# Patient Record
Sex: Female | Born: 1960 | Race: White | Hispanic: No | Marital: Married | Smoking: Never smoker
Health system: Southern US, Community
[De-identification: ages and names within clinical notes are randomized; demographics above are authoritative.]

## PROBLEM LIST (undated history)

## (undated) DIAGNOSIS — J359 Chronic disease of tonsils and adenoids, unspecified: Secondary | ICD-10-CM

## (undated) DIAGNOSIS — I480 Paroxysmal atrial fibrillation: Secondary | ICD-10-CM

## (undated) DIAGNOSIS — R202 Paresthesia of skin: Secondary | ICD-10-CM

## (undated) DIAGNOSIS — G5712 Meralgia paresthetica, left lower limb: Secondary | ICD-10-CM

## (undated) DIAGNOSIS — M1612 Unilateral primary osteoarthritis, left hip: Secondary | ICD-10-CM

## (undated) DIAGNOSIS — L719 Rosacea, unspecified: Secondary | ICD-10-CM

## (undated) DIAGNOSIS — Z6841 Body Mass Index (BMI) 40.0 and over, adult: Secondary | ICD-10-CM

## (undated) DIAGNOSIS — R42 Dizziness and giddiness: Secondary | ICD-10-CM

## (undated) DIAGNOSIS — R7303 Prediabetes: Secondary | ICD-10-CM

## (undated) DIAGNOSIS — M1990 Unspecified osteoarthritis, unspecified site: Secondary | ICD-10-CM

## (undated) DIAGNOSIS — E669 Obesity, unspecified: Secondary | ICD-10-CM

## (undated) DIAGNOSIS — E785 Hyperlipidemia, unspecified: Secondary | ICD-10-CM

## (undated) DIAGNOSIS — R001 Bradycardia, unspecified: Secondary | ICD-10-CM

## (undated) DIAGNOSIS — F419 Anxiety disorder, unspecified: Secondary | ICD-10-CM

## (undated) DIAGNOSIS — R600 Localized edema: Secondary | ICD-10-CM

## (undated) HISTORY — PX: OTHER SURGICAL HISTORY: SHX169

## (undated) HISTORY — PX: TONSILLECTOMY: SUR1361

## (undated) HISTORY — DX: Rosacea, unspecified: L71.9

## (undated) HISTORY — DX: Unspecified osteoarthritis, unspecified site: M19.90

## (undated) HISTORY — DX: Body Mass Index (BMI) 40.0 and over, adult: Z684

## (undated) HISTORY — DX: Prediabetes: R73.03

## (undated) HISTORY — DX: Paresthesia of skin: R20.2

## (undated) HISTORY — PX: COLONOSCOPY: SHX174

## (undated) HISTORY — DX: Anxiety disorder, unspecified: F41.9

## (undated) HISTORY — DX: Meralgia paresthetica, left lower limb: G57.12

## (undated) HISTORY — DX: Chronic disease of tonsils and adenoids, unspecified: J35.9

## (undated) HISTORY — DX: Obesity, unspecified: E66.9

## (undated) HISTORY — DX: Unilateral primary osteoarthritis, left hip: M16.12

## (undated) HISTORY — DX: Localized edema: R60.0

## (undated) HISTORY — DX: Hyperlipidemia, unspecified: E78.5

---

## 2003-07-30 ENCOUNTER — Other Ambulatory Visit: Admission: RE | Admit: 2003-07-30 | Discharge: 2003-07-30 | Payer: Self-pay | Admitting: Gastroenterology

## 2004-07-20 ENCOUNTER — Other Ambulatory Visit: Admission: RE | Admit: 2004-07-20 | Discharge: 2004-07-20 | Payer: Self-pay | Admitting: Family Medicine

## 2005-08-30 ENCOUNTER — Other Ambulatory Visit: Admission: RE | Admit: 2005-08-30 | Discharge: 2005-08-30 | Payer: Self-pay | Admitting: Family Medicine

## 2005-09-25 ENCOUNTER — Encounter: Admission: RE | Admit: 2005-09-25 | Discharge: 2005-09-25 | Payer: Self-pay | Admitting: Family Medicine

## 2006-12-10 ENCOUNTER — Other Ambulatory Visit: Admission: RE | Admit: 2006-12-10 | Discharge: 2006-12-10 | Payer: Self-pay | Admitting: Family Medicine

## 2007-12-12 ENCOUNTER — Other Ambulatory Visit: Admission: RE | Admit: 2007-12-12 | Discharge: 2007-12-12 | Payer: Self-pay | Admitting: Family Medicine

## 2008-07-28 ENCOUNTER — Encounter: Admission: RE | Admit: 2008-07-28 | Discharge: 2008-07-28 | Payer: Self-pay | Admitting: Family Medicine

## 2008-12-25 ENCOUNTER — Other Ambulatory Visit: Admission: RE | Admit: 2008-12-25 | Discharge: 2008-12-25 | Payer: Self-pay | Admitting: Family Medicine

## 2010-02-04 ENCOUNTER — Encounter: Admission: RE | Admit: 2010-02-04 | Discharge: 2010-02-04 | Payer: Self-pay | Admitting: Family Medicine

## 2010-05-21 ENCOUNTER — Ambulatory Visit (HOSPITAL_BASED_OUTPATIENT_CLINIC_OR_DEPARTMENT_OTHER)
Admission: RE | Admit: 2010-05-21 | Discharge: 2010-05-21 | Payer: Self-pay | Source: Home / Self Care | Attending: Family Medicine | Admitting: Family Medicine

## 2011-10-19 ENCOUNTER — Other Ambulatory Visit: Payer: Self-pay | Admitting: Family Medicine

## 2011-10-19 DIAGNOSIS — Z1231 Encounter for screening mammogram for malignant neoplasm of breast: Secondary | ICD-10-CM

## 2011-10-23 ENCOUNTER — Ambulatory Visit
Admission: RE | Admit: 2011-10-23 | Discharge: 2011-10-23 | Disposition: A | Discharge status: Home / Self Care | Payer: 59 | Source: Ambulatory Visit | Attending: Family Medicine | Admitting: Family Medicine

## 2011-10-23 DIAGNOSIS — Z1231 Encounter for screening mammogram for malignant neoplasm of breast: Secondary | ICD-10-CM

## 2013-04-15 ENCOUNTER — Other Ambulatory Visit (HOSPITAL_BASED_OUTPATIENT_CLINIC_OR_DEPARTMENT_OTHER): Payer: Self-pay | Admitting: Family Medicine

## 2013-04-15 DIAGNOSIS — R42 Dizziness and giddiness: Secondary | ICD-10-CM

## 2013-04-17 ENCOUNTER — Ambulatory Visit (HOSPITAL_BASED_OUTPATIENT_CLINIC_OR_DEPARTMENT_OTHER)
Admission: RE | Admit: 2013-04-17 | Discharge: 2013-04-17 | Disposition: A | Discharge status: Home / Self Care | Payer: 59 | Source: Ambulatory Visit | Attending: Family Medicine | Admitting: Family Medicine

## 2013-04-17 DIAGNOSIS — I10 Essential (primary) hypertension: Secondary | ICD-10-CM | POA: Insufficient documentation

## 2013-04-17 DIAGNOSIS — R42 Dizziness and giddiness: Secondary | ICD-10-CM | POA: Insufficient documentation

## 2013-04-17 DIAGNOSIS — I6529 Occlusion and stenosis of unspecified carotid artery: Secondary | ICD-10-CM | POA: Insufficient documentation

## 2013-04-18 ENCOUNTER — Ambulatory Visit (HOSPITAL_BASED_OUTPATIENT_CLINIC_OR_DEPARTMENT_OTHER): Payer: 59

## 2013-04-29 ENCOUNTER — Ambulatory Visit (HOSPITAL_BASED_OUTPATIENT_CLINIC_OR_DEPARTMENT_OTHER)
Admission: RE | Admit: 2013-04-29 | Discharge: 2013-04-29 | Disposition: A | Discharge status: Home / Self Care | Payer: 59 | Source: Ambulatory Visit | Attending: Family Medicine | Admitting: Family Medicine

## 2013-04-29 ENCOUNTER — Other Ambulatory Visit (HOSPITAL_BASED_OUTPATIENT_CLINIC_OR_DEPARTMENT_OTHER): Payer: Self-pay | Admitting: Family Medicine

## 2013-04-29 DIAGNOSIS — R42 Dizziness and giddiness: Secondary | ICD-10-CM | POA: Insufficient documentation

## 2013-05-29 ENCOUNTER — Encounter: Payer: Self-pay | Admitting: Neurology

## 2013-05-29 ENCOUNTER — Telehealth: Payer: Self-pay | Admitting: *Deleted

## 2013-05-29 ENCOUNTER — Ambulatory Visit (INDEPENDENT_AMBULATORY_CARE_PROVIDER_SITE_OTHER): Payer: 59 | Admitting: Neurology

## 2013-05-29 VITALS — BP 130/82 | HR 68 | Temp 98.0°F | Resp 16 | Ht 67.0 in | Wt 270.8 lb

## 2013-05-29 DIAGNOSIS — H8120 Vestibular neuronitis, unspecified ear: Secondary | ICD-10-CM

## 2013-05-29 LAB — BASIC METABOLIC PANEL
BUN: 13 mg/dL (ref 6–23)
CALCIUM: 9.3 mg/dL (ref 8.4–10.5)
CHLORIDE: 103 meq/L (ref 96–112)
CO2: 26 mEq/L (ref 19–32)
Creatinine, Ser: 0.8 mg/dL (ref 0.4–1.2)
GFR: 85.37 mL/min (ref >60.00)
GLUCOSE: 92 mg/dL (ref 70–99)
Potassium: 3.6 mEq/L (ref 3.5–5.1)
Sodium: 137 mEq/L (ref 135–145)

## 2013-05-29 NOTE — Patient Instructions (Signed)
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  I don't think it is anything serious. 1.  I would like to order two tests, namely audiometry and electronystagmography. 2.  After the above testing is performed, we can consider starting oxcarbazepine (Trileptal), which is a seizure medication that may be helpful in treating these dizzy spells. 3.  We may contact you regarding getting further imaging of the head. 4.  Let's follow up in 4 weeks and see how things are going from there.

## 2013-05-29 NOTE — Addendum Note (Signed)
Addended by: Glean SalenVAN DER GLAS, Maurico Perrell E on: 05/29/2013 10:49 AM   Modules accepted: Orders

## 2013-05-29 NOTE — Telephone Encounter (Signed)
Spoke with Ms Para MarchDuncan she is aware of appt  With Dr Jenne PaneBates on 06/03/13 at 1:15pm  Dr's note faxed to [phone]

## 2013-05-29 NOTE — Progress Notes (Signed)
NEUROLOGY CONSULTATION NOTE  Deborah Baldwin MRN: [id number] DOB: [birth year]  Referring provider: Gillermina Hu, FNP-C Primary care provider: Gillermina Hu, FNP-C  Reason for consult:  Dizziness  HISTORY OF PRESENT ILLNESS: Deborah Baldwin is a 53 year old right-handed woman with history of hypertension who presents for dizziness..  Records and images were personally reviewed where available.    Symptoms started a year ago.  She experiences brief spell of dizziness, described as a sense of movement as if turning head too quickly, but not clear spinning.  It is not associated with nausea, vomiting, headache, visual disturbance (double vision, blurred vision, vision loss), lightheadedness, facial numbness or focal symptoms of the extremities.  It usually lasts 5-10 seconds but has lasted up to 30 seconds.  It occurs only when sitting and not when laying down.  It occurs spontaneously and not triggered by anything such as change in head movement or position.  It occurs while driving, reading, at her desk on the computer, cooking or just sitting and having a conversation with someone.  She notes some occasional ringing in the ears, as well as hearing a heart beat in her ears or head.  There is no hearing loss that she is aware of, no aural fullness, or ear pain.  She has some nasal congestion but no illnesses.  She denies history of migraine, seizures or past positional vertigo.  They used to occur occasionally, but in October they began occuring three times a day.  However, they have become less frequent over the past month.  Last episode was about over a week ago.  04/29/13 MRI BRAIN WO:  nonspecific small white matter hyperintensities.  Cannot appreciate any vascular loop around the vestibular nerve. 04/17/13 CAROTID DUPLEX:  no hemodynamically significant stenosis.  PAST MEDICAL HISTORY: Past Medical History  Diagnosis Date  . hypertension     PAST SURGICAL HISTORY: Past Surgical History    Procedure Laterality Date  . Endoablatin    . Tonsillectomy      MEDICATIONS: Lisinopril-HCTZ 20-12.5mg  daily  ALLERGIES: Allergies  Allergen Reactions  . Tetracyclines & Related Hives    FAMILY HISTORY: Family History  Problem Relation Age of Onset  . Prostate cancer Father     SOCIAL HISTORY: History   Social History  . Marital Status: Married    Spouse Name: N/A    Number of Children: N/A  . Years of Education: N/A   Occupational History  . Not on file.   Social History Main Topics  . Smoking status: Never Smoker   . Smokeless tobacco: Not on file  . Alcohol Use: 5.0 oz/week    10 drink(s) per week     Comment: 1-2  daily  . Drug Use: Not on file  . Sexual Activity: Not on file   Other Topics Concern  . Not on file   Social History Narrative  . No narrative on file    REVIEW OF SYSTEMS: Constitutional: No fevers, chills, or sweats, no generalized fatigue, change in appetite Eyes: No visual changes, double vision, eye pain Ear, nose and throat: No hearing loss, ear pain, nasal congestion, sore throat Cardiovascular: No chest pain, palpitations Respiratory:  No shortness of breath at rest or with exertion, wheezes GastrointestinaI: No nausea, vomiting, diarrhea, abdominal pain, fecal incontinence Genitourinary:  No dysuria, urinary retention or frequency Musculoskeletal:  No neck pain, back pain Integumentary: No rash, pruritus, skin lesions Neurological: as above Psychiatric: No depression, insomnia, anxiety Endocrine: No palpitations, fatigue, diaphoresis, mood swings,  change in appetite, change in weight, increased thirst Hematologic/Lymphatic:  No anemia, purpura, petechiae. Allergic/Immunologic: no itchy/runny eyes, nasal congestion, recent allergic reactions, rashes  PHYSICAL EXAM: Filed Vitals:   05/29/13 0829  BP: 130/82  Pulse: 68  Temp: 98 F (36.7 C)  Resp: 16   General: No acute distress Head:  Normocephalic/atraumatic Neck:  supple, no paraspinal tenderness, full range of motion Back: No paraspinal tenderness Heart: regular rate and rhythm Lungs: Clear to auscultation bilaterally. Vascular: No carotid bruits. Neurological Exam: Mental status: alert and oriented to person, place, and time, speech fluent and not dysarthric, language intact. Cranial nerves: CN I: not tested CN II: pupils equal, round and reactive to light, visual fields intact, fundi unremarkable. CN III, IV, VI:  full range of motion, no nystagmus, no ptosis CN V: facial sensation intact CN VII: upper and lower face symmetric CN VIII: hearing intact CN IX, X: gag intact, uvula midline CN XI: sternocleidomastoid and trapezius muscles intact CN XII: tongue midline Bulk & Tone: normal, no fasciculations. Motor: 5/5 throughout Sensation: pinprick and vibration intact Deep Tendon Reflexes: 2+ throughout, toes down Finger to nose testing: no dysmetria Heel to shin: no dysmetria Gait: normal stance and stride.  Able to walk in tandem. Romberg negative. Dix-Hallpike negative. Hyperventilation triggered a spell.  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  Will order audiometry and electronystagmography (may need to refer to ENT for this). 2.  Will review MRI with radiology to see if there is any appearance of vascular loop compressing the vestibular nerve on either side.  May consider further imaging. 3   After the vestibular testing, will consider starting oxcarbazepine XR 150mg  daily to treat symptoms (will check BMP today). 4.  Follow up in 4 weeks.  45 minutes spent with patient, over 50% spent counseling and coordinating care.  Thank you for allowing me to take part in the care of this patient.  Shon MilletAdam Jaffe, DO  CC:  Gillermina Huracy Thomas, FNP-C

## 2013-05-30 ENCOUNTER — Telehealth: Payer: Self-pay | Admitting: *Deleted

## 2013-05-30 NOTE — Telephone Encounter (Signed)
Patient is aware of MRI being neg

## 2013-07-03 ENCOUNTER — Ambulatory Visit: Payer: 59 | Admitting: Cardiology

## 2013-07-30 ENCOUNTER — Encounter: Payer: Self-pay | Admitting: Cardiology

## 2013-07-30 ENCOUNTER — Ambulatory Visit (INDEPENDENT_AMBULATORY_CARE_PROVIDER_SITE_OTHER): Payer: 59 | Admitting: Cardiology

## 2013-07-30 VITALS — BP 132/87 | HR 75 | Ht 66.0 in | Wt 272.0 lb

## 2013-07-30 DIAGNOSIS — R42 Dizziness and giddiness: Secondary | ICD-10-CM

## 2013-07-30 DIAGNOSIS — E669 Obesity, unspecified: Secondary | ICD-10-CM | POA: Insufficient documentation

## 2013-07-30 DIAGNOSIS — J359 Chronic disease of tonsils and adenoids, unspecified: Secondary | ICD-10-CM | POA: Insufficient documentation

## 2013-07-30 MED ORDER — LISINOPRIL 20 MG PO TABS: 20.0000 mg | ORAL_TABLET | Freq: Every day | ORAL | Status: DC

## 2013-07-30 NOTE — Progress Notes (Signed)
[address] [place], [place]  [address] Phone: [phone] Fax:  [phone]  Date:  07/30/2013   ID:  Deborah MantisMargaret Mendosa, DOB 08/22/1960, MRN [id number]  PCP:  Lenora BoysFRIED, ROBERT L, MD   History of Present Illness: Deborah Baldwin is a 53 y.o. female here for evaluation of dizziness.Light headed, "no Spinning".  Approximately one year ago she began having more frequent episodes of dizziness. This seems to happen when she is sitting down not necessarily when laying down. Each episode lasts approximately 20-45 seconds and these occur almost 3 times per day. Only in day time. Never when evening. Was in intense time at work. Works at [employer]. Now having short episodes per day. No syncope, no nausea. Dietary logs. This can occur while she is on her way to work after eating breakfast. No identifiable triggers noted. She has checked her blood pressure and is normal at that time. No syncope, no visual changes, no weakness. Orthostatic blood pressures were taken and showed mild decrease from lying 126/91, pulse 70-standing 116/80, pulse 76. LDL 136. TSH normal, hemoglobin 14.  Neurology evaluation - MRI EEG OK. Declined antiseizure drug. ENT OK.    Wt Readings from Last 3 Encounters:  07/30/13 272 lb (123.378 kg)  05/29/13 270 lb 12.8 oz (122.834 kg)     Past Medical History  Diagnosis Date  . hypertension   . Obesity   . Hyperlipidemia   . Arthritis     , Subtolar  . Rosacea     Past Surgical History  Procedure Laterality Date  . Endoablatin    . Tonsillectomy    . Colonoscopy      Current Outpatient Prescriptions  Medication Sig Dispense Refill  . Azelaic Acid (FINACEA) 15 % cream Apply 1 application topically 2 (two) times daily. After skin is thoroughly washed and patted dry, gently but thoroughly massage a thin film of azelaic acid cream into the affected area twice daily, in the morning and evening.      Marland Kitchen. lisinopril-hydrochlorothiazide  (PRINZIDE,ZESTORETIC) 20-12.5 MG per tablet Take 1 tablet by mouth daily.      . Multiple Vitamin (MULTIVITAMIN) tablet Take 1 tablet by mouth daily.      . naproxen (NAPROSYN) 500 MG tablet Take 500 mg by mouth 2 (two) times daily with a meal.      . Omega-3 Fatty Acids ([employer] FISH OIL) 1000 MG CAPS Take 1 capsule by mouth daily.       No current facility-administered medications for this visit.    Allergies:    Allergies  Allergen Reactions  . Minocin [Minocycline Hcl] Hives  . Tetracyclines & Related Hives    Social History:  The patient  reports that she has never smoked. She does not have any smokeless tobacco history on file. She reports that she drinks about 5.0 ounces of alcohol per week. works at [employer]  Family History  Problem Relation Age of Onset  . Prostate cancer Father   . Hypertension Father   . Hypertension Mother   . Colon polyps Mother   . Diverticulitis Mother   . Heart disease Maternal Grandfather   . Colon cancer Maternal Grandfather   . Hypertension Paternal Grandmother   . Dementia Paternal Grandfather     ROS:  Please see the history of present illness.   No strokelike symptoms, no fevers, no cough, no vomiting, no palpitations, no frank syncope, no visual changes.   All other systems  reviewed and negative.   PHYSICAL EXAM: VS:  BP 132/87  Pulse 75  Ht 5\' 6"  (1.676 m)  Wt 272 lb (123.378 kg)  BMI 43.92 kg/m2 Well nourished, well developed, in no acute distress HEENT: normal, [place]/AT, EOMI Neck: no JVD, normal carotid upstroke, no bruit Cardiac:  normal S1, S2; RRR; no murmur Lungs:  clear to auscultation bilaterally, no wheezing, rhonchi or rales Abd: soft, nontender, no hepatomegaly, no bruits Ext:  2+ distal pulses, trace edema Skin: warm and dry GU: deferred Neuro: no focal abnormalities noted, AAO x 3  EKG:  07/30/13-normal rhythm, 75, normal EKG Labs: LDL 136, TSH normal, hemoglobin normal. Unremarkable. Prior medical records  reviewed.     ASSESSMENT AND PLAN:  1. Dizziness-lightheadedness. Extensive workup including MRI, EEG. Neurology evaluation. ENT evaluation unremarkable. She's still having these lightheadedness episodes about 2-3 times a day. Could be associated with stress. I do want to make sure that there are no dangerous arrhythmias associated with these episodes. I do not appreciate any heart murmur. Could this also be associated with change in breathing pattern/hyperventilation like episode which can result in lightheadedness? Her blood pressure at home is usually in the 110 systolic. I will try to discontinue her hydrochlorothiazide portion of her lisinopril and see how this makes her feel. Watch for any signs of elevated blood pressure. We may need to add this back. Consider compression stockings for edema. 2. Morbid Obesity-encourage weight loss. Exercise 3. We will see her back in one month.  Signed, Donato Schultz, MD [facility]  07/30/2013 10:21 AM

## 2013-07-30 NOTE — Patient Instructions (Addendum)
Your physician has recommended you make the following change in your medication:   1. Stop Lisinopril-HCTZ 2. Start Lisinopril 20 mg once daily  Your physician has recommended that you wear a 24 hour holter monitor. Holter monitors are medical devices that record the heart's electrical activity. Doctors most often use these monitors to diagnose arrhythmias. Arrhythmias are problems with the speed or rhythm of the heartbeat. The monitor is a small, portable device. You can wear one while you do your normal daily activities. This is usually used to diagnose what is causing palpitations/syncope (passing out).  Your physician recommends that you schedule a follow-up appointment in: 1 month with Dr. Anne FuSkains.

## 2013-08-05 ENCOUNTER — Encounter (INDEPENDENT_AMBULATORY_CARE_PROVIDER_SITE_OTHER): Payer: 59

## 2013-08-05 ENCOUNTER — Encounter: Payer: Self-pay | Admitting: *Deleted

## 2013-08-05 DIAGNOSIS — R42 Dizziness and giddiness: Secondary | ICD-10-CM

## 2013-08-05 NOTE — Progress Notes (Signed)
Patient ID: Deborah Baldwin, female   DOB: 09/17/1960, 53 y.o.   MRN: [id number] E-Cardio 24 hour holter monitor applied to patient.

## 2013-08-14 ENCOUNTER — Telehealth: Payer: Self-pay | Admitting: Cardiology

## 2013-08-14 NOTE — Telephone Encounter (Signed)
New message     Want monitor results.  OK to leave msg on vm at work or cell

## 2013-08-15 NOTE — Telephone Encounter (Signed)
Spoke with patient advised that results are in but Dr. Anne FuSkains hasn't read the results, advise once he reads the results I will call with the results..Marland Kitchen

## 2013-08-18 ENCOUNTER — Telehealth: Payer: Self-pay | Admitting: Cardiology

## 2013-08-18 NOTE — Telephone Encounter (Signed)
Brief episode of atrial fibrillation occurred in the middle of the night lasting approximately one minutes duration with heart rate ranging from 118-150. I would like for her to come back to clinic to discuss this. Being female with hypertension (CHADS-VASc of 2) like to discuss anticoagulation as well as possible evaluation for sleep study. Interestingly, her marked symptoms of lightheadedness did not correlate with atrial fibrillation or any adverse arrhythmias.  Please have her come back to clinic to discuss.  Donato SchultzMark Juda Toepfer, MD

## 2013-08-19 NOTE — Telephone Encounter (Signed)
LVM for patient to call the office and advised that an Dr. Anne FuSkains wants to see her in the office.

## 2013-08-20 ENCOUNTER — Encounter: Payer: Self-pay | Admitting: Cardiology

## 2013-08-20 ENCOUNTER — Ambulatory Visit (INDEPENDENT_AMBULATORY_CARE_PROVIDER_SITE_OTHER): Payer: 59 | Admitting: Cardiology

## 2013-08-20 VITALS — BP 157/106 | HR 90 | Ht 66.0 in | Wt 275.1 lb

## 2013-08-20 DIAGNOSIS — I4891 Unspecified atrial fibrillation: Secondary | ICD-10-CM

## 2013-08-20 DIAGNOSIS — I10 Essential (primary) hypertension: Secondary | ICD-10-CM

## 2013-08-20 MED ORDER — DILTIAZEM HCL ER COATED BEADS 120 MG PO CP24: 120.0000 mg | ORAL_CAPSULE | Freq: Every day | ORAL | Status: DC

## 2013-08-20 MED ORDER — RIVAROXABAN 20 MG PO TABS: 20.0000 mg | ORAL_TABLET | Freq: Every day | ORAL | Status: DC

## 2013-08-20 MED ORDER — LISINOPRIL-HYDROCHLOROTHIAZIDE 20-12.5 MG PO TABS: 1.0000 | ORAL_TABLET | Freq: Every day | ORAL | Status: DC

## 2013-08-20 MED ORDER — LISINOPRIL 20 MG PO TABS: 20.0000 mg | ORAL_TABLET | Freq: Every day | ORAL | Status: DC

## 2013-08-20 NOTE — Telephone Encounter (Signed)
[facility] Please call back.

## 2013-08-20 NOTE — Telephone Encounter (Signed)
Dr Anne FuSkains original 4/13 note was about pts holter monitor results. Called pt to arrange an appointment with Dr Anne FuSkains today 4/15 at 9:45am. Pt verbalized understanding and confirmed appointment.

## 2013-08-20 NOTE — Progress Notes (Signed)
[address] [place], [place]  [address] Phone: [phone] Fax:  [phone]  Date:  08/20/2013   ID:  Deborah Baldwin, DOB 01/28/1961, MRN [id number]  PCP:  Lenora BoysFRIED, ROBERT L, MD   History of Present Illness: Deborah Baldwin is a 53 y.o. female here for followup of dizziness.Light headed, "no Spinning".  Approximately one year ago she began having more frequent episodes of dizziness. This seems to happen when she is sitting down not necessarily when laying down. Each episode lasts approximately 20-45 seconds and these occur almost 3 times per day. Only in day time. Never when evening. Was in intense time at work. Works at [employer]. Now having short episodes per day. No syncope, no nausea. Dietary logs. This can occur while she is on her way to work after eating breakfast. No identifiable triggers noted. She has checked her blood pressure and is normal at that time. No syncope, no visual changes, no weakness. Orthostatic blood pressures were taken and showed mild decrease from lying 126/91, pulse 70-standing 116/80, pulse 76. LDL 136. TSH normal, hemoglobin 14.  Neurology evaluation - MRI EEG OK. Declined antiseizure drug. ENT OK.   Holter monitor-this demonstrated a brief episode of atrial fibrillation with rapid ventricular response in the nighttime hours and possibly 1 AM. This episode lasted approximately 1 minute duration. She denies any snoring, daytime somnolence. Her husband does have sleep apnea. No excessive alcohol. No recent fevers, infections. TSH as above was normal. She did note on her Holter monitor that she had a brief episode of dizziness during the day however this correlated with normal sinus rhythm.   Wt Readings from Last 3 Encounters:  08/20/13 275 lb 1.9 oz (124.794 kg)  07/30/13 272 lb (123.378 kg)  05/29/13 270 lb 12.8 oz (122.834 kg)     Past Medical History  Diagnosis Date  . hypertension   . Obesity   . Hyperlipidemia   .  Arthritis     , Subtolar  . Rosacea     Past Surgical History  Procedure Laterality Date  . Endoablatin    . Tonsillectomy    . Colonoscopy      Current Outpatient Prescriptions  Medication Sig Dispense Refill  . Azelaic Acid (FINACEA) 15 % cream Apply 1 application topically 2 (two) times daily. After skin is thoroughly washed and patted dry, gently but thoroughly massage a thin film of azelaic acid cream into the affected area twice daily, in the morning and evening.      Marland Kitchen. lisinopril (PRINIVIL,ZESTRIL) 20 MG tablet Take 1 tablet (20 mg total) by mouth daily.  30 tablet  6  . Multiple Vitamin (MULTIVITAMIN) tablet Take 1 tablet by mouth daily.      . naproxen (NAPROSYN) 500 MG tablet Take 500 mg by mouth 2 (two) times daily with a meal.      . Omega-3 Fatty Acids ([employer] FISH OIL) 1000 MG CAPS Take 1 capsule by mouth daily.       No current facility-administered medications for this visit.    Allergies:    Allergies  Allergen Reactions  . Minocin [Minocycline Hcl] Hives  . Tetracyclines & Related Hives    Social History:  The patient  reports that she has never smoked. She does not have any smokeless tobacco history on file. She reports that she drinks about 5 ounces of alcohol per week. works at [employer]  Family History  Problem Relation Age of Onset  .  Prostate cancer Father   . Hypertension Father   . Hypertension Mother   . Colon polyps Mother   . Diverticulitis Mother   . Heart disease Maternal Grandfather   . Colon cancer Maternal Grandfather   . Hypertension Paternal Grandmother   . Dementia Paternal Grandfather     ROS:  Please see the history of present illness.   No strokelike symptoms, no fevers, no cough, no vomiting, no palpitations, no frank syncope, no visual changes.   All other systems reviewed and negative.   PHYSICAL EXAM: VS:  BP 157/106  Pulse 90  Ht 5\' 6"  (1.676 m)  Wt 275 lb 1.9 oz (124.794 kg)  BMI 44.43 kg/m2 Well nourished, well  developed, in no acute distress HEENT: normal, [place]/AT, EOMI Neck: no JVD, normal carotid upstroke, no bruit Cardiac:  normal S1, S2; RRR; no murmur Lungs:  clear to auscultation bilaterally, no wheezing, rhonchi or rales Abd: soft, nontender, no hepatomegaly, no bruits Ext:  2+ distal pulses, trace edema Skin: warm and dry GU: deferred Neuro: no focal abnormalities noted, AAO x 3  EKG:  07/30/13-normal rhythm, 75, normal EKG Labs: LDL 136, TSH normal, hemoglobin normal. Unremarkable. Prior medical records reviewed.     ASSESSMENT AND PLAN:  1. Paroxysmal atrial fibrillation-brief episode noted during Holter monitoring in the middle of the night. This could be associated with sleep apnea. We had lengthy discussion about atrial fibrillation, implications, possibility of risk of stroke. Her CHADS-VASc is 2 (Female and HTN). Because of this, she warrants anticoagulation. We discussed the risks and benefits of this medication including bleeding. We will start Xarelto 20mg  QD. Her renal function and hemoglobin were normal. We will repeat in 6 months. I will also start her on low-dose diltiazem CD 120 mg once a day. This will also help with her blood pressure as well as decreased rate of atrial fibrillation if episode occurs. Since the episode occurred in the middle of the night, I will check a sleep study to ensure that she does not have any episode of sleep apnea which could be a precipitating factor for atrial fibrillation. We discussed other factors such as alcohol use, weight loss. I will check echocardiogram to ensure proper structure and function of her heart. 2. Dizziness-lightheadedness. Extensive workup including MRI, EEG. Neurology evaluation. ENT evaluation unremarkable. She did have a brief episode of atrial fibrillation in the middle of the night which was asymptomatic. She did have an episode of dizziness which she pushed the button on the Holter monitor and this correlated only with normal  sinus rhythm. She's still having these lightheadedness episodes about 2-3 times a day. Could be associated with stress. Breathing pattern/hyperventilation like episode which can result in lightheadedness? Her blood pressure has elevated since a trial of stopping the HCTZ portion of lisinopril. We will resume. 20/12.5. 3. Morbid Obesity-encourage weight loss. Exercise 4. We will see her back in 2 weeks, appointment already scheduled. 5. 30 minute counseling discussion on atrial fibrillation.  Signed, Donato SchultzMark Governor Matos, MD [facility]  08/20/2013 10:14 AM

## 2013-08-20 NOTE — Patient Instructions (Signed)
Your physician has recommended you make the following change in your medication:  1)STOP LISINOPRIL 2)STOP OMEGA 3 3)STOP NAPROYSEN 4)START LISINOPRIL 20-12.5MG  DAILY 5)START DILTIAZEM CD 120MG  DAILY 6)START XARELTO 20MG  EVERY EVENING   Your physician has recommended that you have a sleep study. This test records several body functions during sleep, including: brain activity, eye movement, oxygen and carbon dioxide blood levels, heart rate and rhythm, breathing rate and rhythm, the flow of air through your mouth and nose, snoring, body muscle movements, and chest and belly movement.  Your physician has requested that you have an echocardiogram. Echocardiography is a painless test that uses sound waves to create images of your heart. It provides your doctor with information about the size and shape of your heart and how well your heart's chambers and valves are working. This procedure takes approximately one hour. There are no restrictions for this procedure.  KEEP YOUR FOLLOW UP APPT SCHEDULED ON 09/03/13 @ 11:45AM

## 2013-08-26 ENCOUNTER — Encounter: Payer: Self-pay | Admitting: Cardiology

## 2013-08-26 ENCOUNTER — Ambulatory Visit (HOSPITAL_COMMUNITY): Payer: 59 | Attending: Cardiovascular Disease | Admitting: Cardiology

## 2013-08-26 DIAGNOSIS — I4891 Unspecified atrial fibrillation: Secondary | ICD-10-CM

## 2013-08-26 DIAGNOSIS — I10 Essential (primary) hypertension: Secondary | ICD-10-CM | POA: Insufficient documentation

## 2013-08-26 NOTE — Progress Notes (Signed)
Echo performed. 

## 2013-09-03 ENCOUNTER — Encounter: Payer: Self-pay | Admitting: Cardiology

## 2013-09-03 ENCOUNTER — Ambulatory Visit (INDEPENDENT_AMBULATORY_CARE_PROVIDER_SITE_OTHER): Payer: 59 | Admitting: Cardiology

## 2013-09-03 VITALS — BP 149/100 | HR 81 | Ht 67.0 in | Wt 273.8 lb

## 2013-09-03 DIAGNOSIS — I4891 Unspecified atrial fibrillation: Secondary | ICD-10-CM

## 2013-09-03 DIAGNOSIS — I480 Paroxysmal atrial fibrillation: Secondary | ICD-10-CM | POA: Insufficient documentation

## 2013-09-03 DIAGNOSIS — Z7901 Long term (current) use of anticoagulants: Secondary | ICD-10-CM | POA: Insufficient documentation

## 2013-09-03 NOTE — Progress Notes (Signed)
[address] [place], [place]  [address] Phone: [phone] Fax:  [phone]  Date:  09/03/2013   ID:  Deborah MantisMargaret Porada, DOB 09/07/1960, MRN [id number]  PCP:  Lenora BoysFRIED, ROBERT L, MD   History of Present Illness: Deborah Baldwin is a 53 y.o. female here for follow up of newly discovered paroxysmal atrial fibrillation seen at 1:30 AM with rapid ventricular response during 24-hour Holter monitor. Started anticoagulation with Xarelto. Echocardiogram with mild LVH, normal ejection fraction. Sleep study pending. Asymptomatic. Husband present.   Previous workup was done secondary to dizziness. She did note dizziness on her Holter monitor however this was associated with sinus rhythm.   Light headed, "no Spinning".  Approximately one year ago she began having more frequent episodes of dizziness. This seems to happen when she is sitting down not necessarily when laying down. Each episode lasts approximately 20-45 seconds and these occur almost 3 times per day. Only in day time. Never when evening. Was in intense time at work. Works at [employer]. Now having short episodes per day. No syncope, no nausea. Dietary logs. This can occur while she is on her way to work after eating breakfast. No identifiable triggers noted. She has checked her blood pressure and is normal at that time. No syncope, no visual changes, no weakness. Orthostatic blood pressures were taken and showed mild decrease from lying 126/91, pulse 70-standing 116/80, pulse 76. LDL 136. TSH normal, hemoglobin 14.  Neurology evaluation - MRI EEG OK. Declined antiseizure drug. ENT OK.   Echocardiogram 08/26/13: - Normal LV size with mild LV hypertrophy. EF 60-65%. Normal RV size and systolic function. No significant valvular abnormalities.  Holter monitor 08/05/13: Brief episode of atrial fibrillation occurred in the middle of the night lasting approximately 1 minute duration with heart rate ranging from 118-150.  Anticoagulation discussion. Discussed with EP. This did not correlate with lightheadedness. She did report lightheadedness at one point but this was with normal rhythm. Sleep study ordered.    Wt Readings from Last 3 Encounters:  09/03/13 273 lb 12.8 oz (124.195 kg)  08/20/13 275 lb 1.9 oz (124.794 kg)  07/30/13 272 lb (123.378 kg)     Past Medical History  Diagnosis Date  . hypertension   . Obesity   . Hyperlipidemia   . Arthritis     , Subtolar  . Rosacea     Past Surgical History  Procedure Laterality Date  . Endoablatin    . Tonsillectomy    . Colonoscopy      Current Outpatient Prescriptions  Medication Sig Dispense Refill  . Azelaic Acid (FINACEA) 15 % cream Apply 1 application topically 2 (two) times daily. After skin is thoroughly washed and patted dry, gently but thoroughly massage a thin film of azelaic acid cream into the affected area twice daily, in the morning and evening.      . diltiazem (CARDIZEM CD) 120 MG 24 hr capsule Take 1 capsule (120 mg total) by mouth daily.  30 capsule  11  . lisinopril-hydrochlorothiazide (PRINZIDE) 20-12.5 MG per tablet Take 1 tablet by mouth daily.  30 tablet  11  . Multiple Vitamin (MULTIVITAMIN) tablet Take 1 tablet by mouth daily.      . rivaroxaban (XARELTO) 20 MG TABS tablet Take 1 tablet (20 mg total) by mouth daily with supper.  30 tablet  11   No current facility-administered medications for this visit.    Allergies:    Allergies  Allergen Reactions  .  Minocin [Minocycline Hcl] Hives  . Tetracyclines & Related Hives    Social History:  The patient  reports that she has never smoked. She does not have any smokeless tobacco history on file. She reports that she drinks about 5 ounces of alcohol per week. works at [employer]  Family History  Problem Relation Age of Onset  . Prostate cancer Father   . Hypertension Father   . Hypertension Mother   . Colon polyps Mother   . Diverticulitis Mother   . Heart  disease Maternal Grandfather   . Colon cancer Maternal Grandfather   . Hypertension Paternal Grandmother   . Dementia Paternal Grandfather     ROS:  Please see the history of present illness.   No strokelike symptoms, no fevers, no cough, no vomiting, no palpitations, no frank syncope, no visual changes.   All other systems reviewed and negative.   PHYSICAL EXAM: VS:  BP 149/100  Pulse 81  Ht 5\' 7"  (1.702 m)  Wt 273 lb 12.8 oz (124.195 kg)  BMI 42.87 kg/m2 Well nourished, well developed, in no acute distress HEENT: normal, [place]/AT, EOMI Neck: no JVD Cardiac:  normal S1, S2; RRR; no murmur Lungs:  clear Abd: obese Ext: trace edema Skin: warm and dry GU: deferred Neuro: no focal abnormalities noted, AAO x 3  EKG:  07/30/13-normal rhythm, 75, normal EKG Labs: LDL 136, TSH normal, hemoglobin normal. Unremarkable. Prior medical records reviewed.     ASSESSMENT AND PLAN:  1. Paroxysmal atrial fibrillation-anticoagulation. Brief episode occurred middle of the night rapid ventricular response. Sleep apnea workup. Echocardiogram with normal EF. Mild LVH. Low-dose diltiazem 120 mg a day. Monitor blood work every 6 months. She will likely have this done at her primary physician. 2. Dizziness-lightheadedness. Extensive workup including MRI, EEG. Neurology evaluation. ENT evaluation unremarkable. Could be associated with stress.  Her blood pressure at home is usually in the 110 systolic. At one point I stopped her HCTZ but her blood pressure became too elevated. We have resumed. 3. Mildly dilated aortic root-39 mm. Periodic evaluation with echocardiogram likely in 2-3 years. Discussed. Blood pressure control important. Weight loss. 4. Morbid Obesity-encourage weight loss. Exercise 5. We will see her back in 6 month. Today spent approximately 30 minutes with her, mostly counseling on atrial fibrillation with her husband.  Signed, Donato SchultzMark Quinn Bartling, MD [facility]  09/03/2013 12:14 PM

## 2013-09-03 NOTE — Patient Instructions (Signed)
Your physician recommends that you continue on your current medications as directed. Please refer to the Current Medication list given to you today.  Your physician wants you to follow-up in: 6 months with Dr. Skains. You will receive a reminder letter in the mail two months in advance. If you don't receive a letter, please call our office to schedule the follow-up appointment.  

## 2014-05-08 DIAGNOSIS — I480 Paroxysmal atrial fibrillation: Secondary | ICD-10-CM

## 2014-05-08 HISTORY — DX: Paroxysmal atrial fibrillation: I48.0

## 2014-07-11 ENCOUNTER — Other Ambulatory Visit: Payer: Self-pay | Admitting: Cardiology

## 2014-08-08 ENCOUNTER — Other Ambulatory Visit: Payer: Self-pay | Admitting: Cardiology

## 2014-08-30 ENCOUNTER — Other Ambulatory Visit: Payer: Self-pay | Admitting: Cardiology

## 2014-09-24 ENCOUNTER — Other Ambulatory Visit: Payer: Self-pay | Admitting: Cardiology

## 2014-10-07 ENCOUNTER — Other Ambulatory Visit: Payer: Self-pay | Admitting: Cardiology

## 2014-10-24 ENCOUNTER — Other Ambulatory Visit: Payer: Self-pay | Admitting: Cardiology

## 2014-10-28 ENCOUNTER — Other Ambulatory Visit: Payer: Self-pay | Admitting: Cardiology

## 2014-10-29 ENCOUNTER — Telehealth: Payer: Self-pay | Admitting: Cardiology

## 2014-10-29 NOTE — Telephone Encounter (Signed)
Pt was due in April, called today to make fu appt with [facility]. His next appt was 01-15-15, pt needs refills of Diltiazem and Xarelto 30 day supply  at [facility]

## 2014-11-03 ENCOUNTER — Other Ambulatory Visit: Payer: Self-pay | Admitting: Cardiology

## 2015-01-05 ENCOUNTER — Other Ambulatory Visit: Payer: Self-pay | Admitting: Cardiology

## 2015-01-15 ENCOUNTER — Ambulatory Visit (INDEPENDENT_AMBULATORY_CARE_PROVIDER_SITE_OTHER): Payer: 59 | Admitting: Cardiology

## 2015-01-15 ENCOUNTER — Encounter: Payer: Self-pay | Admitting: Cardiology

## 2015-01-15 VITALS — BP 132/90 | HR 74 | Ht 66.0 in | Wt 269.8 lb

## 2015-01-15 DIAGNOSIS — Z7901 Long term (current) use of anticoagulants: Secondary | ICD-10-CM

## 2015-01-15 DIAGNOSIS — I480 Paroxysmal atrial fibrillation: Secondary | ICD-10-CM

## 2015-01-15 MED ORDER — DILTIAZEM HCL ER COATED BEADS 120 MG PO CP24: 120.0000 mg | ORAL_CAPSULE | Freq: Every day | ORAL | Status: DC

## 2015-01-15 MED ORDER — RIVAROXABAN 20 MG PO TABS: 20.0000 mg | ORAL_TABLET | Freq: Every day | ORAL | Status: DC

## 2015-01-15 MED ORDER — LISINOPRIL-HYDROCHLOROTHIAZIDE 20-12.5 MG PO TABS
1.0000 | ORAL_TABLET | Freq: Every day | ORAL | Status: AC
Start: 2015-01-15 — End: ?

## 2015-01-15 NOTE — Patient Instructions (Addendum)
Medication Instructions:  The current medical regimen is effective;  continue present plan and medications.  Please call if you decide to have the 30 day event monitor.  Follow-Up: Follow up in 1 year with Dr. Anne Fu.  You will receive a letter in the mail 2 months before you are due.  Please call us when you receive this letter to schedule your follow up appointment.  Thank you for choosing [facility]!!

## 2015-01-15 NOTE — Progress Notes (Signed)
[address] [place], [place]  [address] Phone: [phone] Fax:  [phone]  Date:  01/15/2015   ID:  Deborah Baldwin, DOB August 16, 1960, MRN [id number]  PCP:  Lenora Boys, MD   History of Present Illness: Deborah Baldwin is a 54 y.o. female here for follow up of newly discovered paroxysmal atrial fibrillation seen at 1:30 AM with rapid ventricular response during prior 24-hour Holter monitor. Anticoagulation with Xarelto. Echocardiogram with mild LVH, normal ejection fraction. Asymptomatic.    Previous workup was done secondary to dizziness. She did note dizziness on her Holter monitor however this was associated with sinus rhythm.   Light headed, "no Spinning".  Approximately one year ago she began having more frequent episodes of dizziness. This seems to happen when she is sitting down not necessarily when laying down. Each episode lasts approximately 20-45 seconds and these occur almost 3 times per day. Only in day time. Never when evening. Was in intense time at work. Works at [employer]. No syncope, no nausea. Dietary logs. This can occur while she is on her way to work after eating breakfast. No identifiable triggers noted. ? Stress trigger. She has checked her blood pressure and is normal at that time. No syncope, no visual changes, no weakness. Orthostatic blood pressures were taken and showed mild decrease from lying 126/91, pulse 70-standing 116/80, pulse 76. LDL 136. TSH normal, hemoglobin 14.  Neurology evaluation - MRI EEG OK. Declined antiseizure drug. ENT OK.   Echocardiogram 08/26/13: - Normal LV size with mild LV hypertrophy. EF 60-65%. Normal RV size and systolic function. No significant valvular abnormalities.  Holter monitor 08/05/13: Brief episode of atrial fibrillation occurred in the middle of the night lasting approximately 1 minute duration with heart rate ranging from 118-150. Anticoagulation discussion. Discussed with EP. This did not  correlate with lightheadedness. She did report lightheadedness at one point but this was with normal rhythm. Sleep study -she's tried several times to call back sleep lab, 3 times, never heard back from them. She feels as though she does not want to take this test. Her husband has a CPAP.      Wt Readings from Last 3 Encounters:  01/15/15 269 lb 12.8 oz (122.38 kg)  09/03/13 273 lb 12.8 oz (124.195 kg)  08/20/13 275 lb 1.9 oz (124.794 kg)     Past Medical History  Diagnosis Date  . hypertension   . Obesity   . Hyperlipidemia   . Arthritis     , Subtolar  . Rosacea     Past Surgical History  Procedure Laterality Date  . Endoablatin    . Tonsillectomy    . Colonoscopy      Current Outpatient Prescriptions  Medication Sig Dispense Refill  . Azelaic Acid (FINACEA) 15 % cream Apply 1 application topically 2 (two) times daily. After skin is thoroughly washed and patted dry, gently but thoroughly massage a thin film of azelaic acid cream into the affected area twice daily, in the morning and evening.    . diltiazem (CARDIZEM CD) 120 MG 24 hr capsule TAKE ONE CAPSULE BY MOUTH EVERY DAY 30 capsule 1  . lisinopril-hydrochlorothiazide (PRINZIDE,ZESTORETIC) 20-12.5 MG per tablet Take 1 tablet by mouth daily. 30 tablet 1  . Multiple Vitamin (MULTIVITAMIN) tablet Take 1 tablet by mouth daily.    Carlena Hurl 20 MG TABS tablet TAKE 1 TABLET BY MOUTH EVERY DAY WITH SUPPER *PT NEEDS APPOINTMENT FOR FUTURE REFILLS 30 tablet 0  No current facility-administered medications for this visit.    Allergies:    Allergies  Allergen Reactions  . Minocin [Minocycline Hcl] Hives  . Tetracyclines & Related Hives    Social History:  The patient  reports that she has never smoked. She does not have any smokeless tobacco history on file. She reports that she drinks about 5.0 oz of alcohol per week. works at [employer] History  Problem Relation Age of Onset  . Prostate cancer Father   .  Hypertension Father   . Hypertension Mother   . Colon polyps Mother   . Diverticulitis Mother   . Heart disease Maternal Grandfather   . Colon cancer Maternal Grandfather   . Hypertension Paternal Grandmother   . Dementia Paternal Grandfather     ROS:  Please see the history of present illness.   No strokelike symptoms, no fevers, no cough, no vomiting, no palpitations, no frank syncope, no visual changes.   All other systems reviewed and negative.   PHYSICAL EXAM: VS:  BP 132/90 mmHg  Pulse 74  Ht 5\' 6"  (1.676 m)  Wt 269 lb 12.8 oz (122.38 kg)  BMI 43.57 kg/m2 Well nourished, well developed, in no acute distress HEENT: normal, [place]/AT, EOMI Neck: no JVD Cardiac:  normal S1, S2; RRR; no murmur Lungs:  clear Abd: obese Ext: trace edema Skin: warm and dry GU: deferred Neuro: no focal abnormalities noted, AAO x 3  EKG:  Today -normal sinus rhythm heart rate 73. personally viewed - 07/30/13-normal rhythm, 75, normal EKG  Labs: LDL 136, TSH normal, hemoglobin normal. Unremarkable. Prior medical records reviewed.     ASSESSMENT AND PLAN:  Paroxysmal atrial fibrillation-anticoagulation. Brief episode occurred middle of the night rapid ventricular response. Sleep apnea workup was not performed, she called sleep lab 3 times with no response, she does not wish for this to be reordered. Echocardiogram with normal EF. Mild LVH. Low-dose diltiazem 120 mg a day. Monitor blood work every 6 months. This patients CHA2DS2-VASc Score and unadjusted Ischemic Stroke Rate (% per year) is equal to 2.2 % stroke rate/year from a score of 2 Above score calculated as 1 point each if present [CHF, HTN, DM, Vascular=MI/PAD/Aortic Plaque, Age if 65-74, or Female] Above score calculated as 2 points each if present [Age > 75, or Stroke/TIA/TE]  Because of this risk score, anticoagulation is recommended. She is interested in coming off of the medication. I would still recommend anticoagulation however if she did  a 30 day event monitor and there was no further atrial fibrillation perhaps she would feel more comfortable with her decision to discontinue anticoagulation. I explained to her that she may very well be asymptomatic with episodes of A. fib. Obesity and hypertension are risk factors for A. fib. Age as well. She did ask about homeopathic remedies. Her that there has not been any large trials with anticoagulation from a homeopathic perspective.  1. Dizziness-lightheadedness. Extensive workup including MRI, EEG. Neurology evaluation. ENT evaluation unremarkable. Could be associated with stress.  Her blood pressure at home is usually in the 110 systolic. At one point I stopped her HCTZ but her blood pressure became too elevated.  2. Mildly dilated aortic root-39 mm. Periodic evaluation with echocardiogram likely in 2 years. Discussed. Blood pressure control important. Weight loss. 3. Morbid Obesity-encourage weight loss. Exercise good job with more weight loss 4. We will see her back in 12 month.   Signed, Donato Schultz, MD [facility]  01/15/2015 8:31 AM

## 2015-02-02 ENCOUNTER — Other Ambulatory Visit: Payer: Self-pay | Admitting: Cardiology

## 2015-03-11 ENCOUNTER — Other Ambulatory Visit: Payer: Self-pay

## 2015-03-11 DIAGNOSIS — Z1231 Encounter for screening mammogram for malignant neoplasm of breast: Secondary | ICD-10-CM

## 2015-03-24 ENCOUNTER — Ambulatory Visit
Admission: RE | Admit: 2015-03-24 | Discharge: 2015-03-24 | Disposition: A | Discharge status: Home / Self Care | Payer: 59 | Source: Ambulatory Visit

## 2015-03-24 DIAGNOSIS — Z1231 Encounter for screening mammogram for malignant neoplasm of breast: Secondary | ICD-10-CM

## 2016-02-25 ENCOUNTER — Other Ambulatory Visit: Payer: Self-pay | Admitting: Family Medicine

## 2016-02-25 DIAGNOSIS — Z1231 Encounter for screening mammogram for malignant neoplasm of breast: Secondary | ICD-10-CM

## 2016-04-03 ENCOUNTER — Ambulatory Visit
Admission: RE | Admit: 2016-04-03 | Discharge: 2016-04-03 | Disposition: A | Discharge status: Home / Self Care | Payer: 59 | Source: Ambulatory Visit | Attending: Family Medicine | Admitting: Family Medicine

## 2016-04-03 DIAGNOSIS — Z1231 Encounter for screening mammogram for malignant neoplasm of breast: Secondary | ICD-10-CM

## 2017-02-27 ENCOUNTER — Other Ambulatory Visit: Payer: Self-pay | Admitting: Family Medicine

## 2017-02-27 DIAGNOSIS — Z1231 Encounter for screening mammogram for malignant neoplasm of breast: Secondary | ICD-10-CM

## 2017-04-04 ENCOUNTER — Ambulatory Visit
Admission: RE | Admit: 2017-04-04 | Discharge: 2017-04-04 | Disposition: A | Discharge status: Home / Self Care | Payer: 59 | Source: Ambulatory Visit | Attending: Family Medicine | Admitting: Family Medicine

## 2017-04-04 DIAGNOSIS — Z1231 Encounter for screening mammogram for malignant neoplasm of breast: Secondary | ICD-10-CM

## 2017-04-06 ENCOUNTER — Other Ambulatory Visit: Payer: Self-pay | Admitting: Family Medicine

## 2017-04-06 DIAGNOSIS — R928 Other abnormal and inconclusive findings on diagnostic imaging of breast: Secondary | ICD-10-CM

## 2017-04-12 ENCOUNTER — Other Ambulatory Visit: Payer: Self-pay | Admitting: Family Medicine

## 2017-04-12 ENCOUNTER — Ambulatory Visit
Admission: RE | Admit: 2017-04-12 | Discharge: 2017-04-12 | Disposition: A | Discharge status: Home / Self Care | Payer: 59 | Source: Ambulatory Visit | Attending: Family Medicine | Admitting: Family Medicine

## 2017-04-12 DIAGNOSIS — R921 Mammographic calcification found on diagnostic imaging of breast: Secondary | ICD-10-CM

## 2017-04-12 DIAGNOSIS — R928 Other abnormal and inconclusive findings on diagnostic imaging of breast: Secondary | ICD-10-CM

## 2017-04-24 ENCOUNTER — Other Ambulatory Visit: Payer: Self-pay | Admitting: Family Medicine

## 2017-04-24 DIAGNOSIS — R921 Mammographic calcification found on diagnostic imaging of breast: Secondary | ICD-10-CM

## 2017-10-11 ENCOUNTER — Ambulatory Visit
Admission: RE | Admit: 2017-10-11 | Discharge: 2017-10-11 | Disposition: A | Discharge status: Home / Self Care | Payer: 59 | Source: Ambulatory Visit | Attending: Family Medicine | Admitting: Family Medicine

## 2017-10-11 ENCOUNTER — Other Ambulatory Visit: Payer: Self-pay | Admitting: Family Medicine

## 2017-10-11 DIAGNOSIS — R921 Mammographic calcification found on diagnostic imaging of breast: Secondary | ICD-10-CM

## 2017-10-15 ENCOUNTER — Ambulatory Visit
Admission: RE | Admit: 2017-10-15 | Discharge: 2017-10-15 | Disposition: A | Discharge status: Home / Self Care | Payer: 59 | Source: Ambulatory Visit | Attending: Family Medicine | Admitting: Family Medicine

## 2017-10-15 DIAGNOSIS — R921 Mammographic calcification found on diagnostic imaging of breast: Secondary | ICD-10-CM

## 2017-10-15 HISTORY — PX: BREAST BIOPSY: SHX20

## 2018-02-15 NOTE — Patient Instructions (Addendum)
Deborah Baldwin  02/15/2018   Your procedure is scheduled on: 02-26-18   Report to [facility] Main  Entrance    Report to admitting at 6:00AM    Surgery is scheduled from 0840 until 0950   Call this number if you have problems the morning of surgery [phone]     Remember: Do not eat food or drink liquids :After Midnight. BRUSH YOUR TEETH MORNING OF SURGERY AND RINSE YOUR MOUTH OUT, NO CHEWING GUM CANDY OR MINTS.     Take these medicines the morning of surgery with A SIP OF WATER: atorvastatin, pepcid                                You may not have any metal on your body including hair pins and              piercings  Do not wear jewelry, make-up, lotions, powders or perfumes, deodorant             Do not wear nail polish.  Do not shave  48 hours prior to surgery.        Do not bring valuables to the hospital. [name] IS NOT             RESPONSIBLE   FOR VALUABLES.  Contacts, dentures or bridgework may not be worn into surgery.  Leave suitcase in the car. After surgery it may be brought to your room.                 Please read over the following fact sheets you were given: _____________________________________________________________________             [facility] - Preparing for Surgery Before surgery, you can play an important role.  Because skin is not sterile, your skin needs to be as free of germs as possible.  You can reduce the number of germs on your skin by washing with CHG (chlorahexidine gluconate) soap before surgery.  CHG is an antiseptic cleaner which kills germs and bonds with the skin to continue killing germs even after washing. Please DO NOT use if you have an allergy to CHG or antibacterial soaps.  If your skin becomes reddened/irritated stop using the CHG and inform your nurse when you arrive at Short Stay. Do not shave (including legs and underarms) for at least 48 hours prior to the first CHG shower.  You may shave your  face/neck. Please follow these instructions carefully:  1.  Shower with CHG Soap the night before surgery and the  morning of Surgery.  2.  If you choose to wash your hair, wash your hair first as usual with your  normal  shampoo.  3.  After you shampoo, rinse your hair and body thoroughly to remove the  shampoo.                           4.  Use CHG as you would any other liquid soap.  You can apply chg directly  to the skin and wash                       Gently with a scrungie or clean washcloth.  5.  Apply the CHG Soap to your body ONLY FROM THE NECK DOWN.  Do not use on face/ open                           Wound or open sores. Avoid contact with eyes, ears mouth and genitals (private parts).                       Wash face,  Genitals (private parts) with your normal soap.             6.  Wash thoroughly, paying special attention to the area where your surgery  will be performed.  7.  Thoroughly rinse your body with warm water from the neck down.  8.  DO NOT shower/wash with your normal soap after using and rinsing off  the CHG Soap.                9.  Pat yourself dry with a clean towel.            10.  Wear clean pajamas.            11.  Place clean sheets on your bed the night of your first shower and do not  sleep with pets. Day of Surgery : Do not apply any lotions/deodorants the morning of surgery.  Please wear clean clothes to the hospital/surgery center.  FAILURE TO FOLLOW THESE INSTRUCTIONS MAY RESULT IN THE CANCELLATION OF YOUR SURGERY PATIENT SIGNATURE_________________________________  NURSE SIGNATURE__________________________________  ________________________________________________________________________   Deborah Baldwin  An incentive spirometer is a tool that can help keep your lungs clear and active. This tool measures how well you are filling your lungs with each breath. Taking long deep breaths may help reverse or decrease the chance of developing breathing  (pulmonary) problems (especially infection) following:  A long period of time when you are unable to move or be active. BEFORE THE PROCEDURE   If the spirometer includes an indicator to show your best effort, your nurse or respiratory therapist will set it to a desired goal.  If possible, sit up straight or lean slightly forward. Try not to slouch.  Hold the incentive spirometer in an upright position. INSTRUCTIONS FOR USE  1. Sit on the edge of your bed if possible, or sit up as far as you can in bed or on a chair. 2. Hold the incentive spirometer in an upright position. 3. Breathe out normally. 4. Place the mouthpiece in your mouth and seal your lips tightly around it. 5. Breathe in slowly and as deeply as possible, raising the piston or the ball toward the top of the column. 6. Hold your breath for 3-5 seconds or for as long as possible. Allow the piston or ball to fall to the bottom of the column. 7. Remove the mouthpiece from your mouth and breathe out normally. 8. Rest for a few seconds and repeat Steps 1 through 7 at least 10 times every 1-2 hours when you are awake. Take your time and take a few normal breaths between deep breaths. 9. The spirometer may include an indicator to show your best effort. Use the indicator as a goal to work toward during each repetition. 10. After each set of 10 deep breaths, practice coughing to be sure your lungs are clear. If you have an incision (the cut made at the time of surgery), support your incision when coughing by placing a pillow or rolled up towels firmly against it. Once you are able to get out of  bed, walk around indoors and cough well. You may stop using the incentive spirometer when instructed by your caregiver.  RISKS AND COMPLICATIONS  Take your time so you do not get dizzy or light-headed.  If you are in pain, you may need to take or ask for pain medication before doing incentive spirometry. It is harder to take a deep breath if you  are having pain. AFTER USE  Rest and breathe slowly and easily.  It can be helpful to keep track of a log of your progress. Your caregiver can provide you with a simple table to help with this. If you are using the spirometer at home, follow these instructions: [place] IF:   You are having difficultly using the spirometer.  You have trouble using the spirometer as often as instructed.  Your pain medication is not giving enough relief while using the spirometer.  You develop fever of 100.5 F (38.1 C) or higher. SEEK IMMEDIATE MEDICAL CARE IF:   You cough up bloody sputum that had not been present before.  You develop fever of 102 F (38.9 C) or greater.  You develop worsening pain at or near the incision site. MAKE SURE YOU:   Understand these instructions.  Will watch your condition.  Will get help right away if you are not doing well or get worse. Document Released: 09/04/2006 Document Revised: 07/17/2011 Document Reviewed: 11/05/2006 Exit[employer] Patient Information 2014 [employer], [place].   ________________________________________________________________________  WHAT IS A BLOOD TRANSFUSION? Blood Transfusion Information  A transfusion is the replacement of blood or some of its parts. Blood is made up of multiple cells which provide different functions.  Red blood cells carry oxygen and are used for blood loss replacement.  White blood cells fight against infection.  Platelets control bleeding.  Plasma helps clot blood.  Other blood products are available for specialized needs, such as hemophilia or other clotting disorders. BEFORE THE TRANSFUSION  Who gives blood for transfusions?   Healthy volunteers who are fully evaluated to make sure their blood is safe. This is blood bank blood. Transfusion therapy is the safest it has ever been in the practice of medicine. Before blood is taken from a donor, a complete history is taken to make sure that person has  no history of diseases nor engages in risky social behavior (examples are intravenous drug use or sexual activity with multiple partners). The donor's travel history is screened to minimize risk of transmitting infections, such as malaria. The donated blood is tested for signs of infectious diseases, such as HIV and hepatitis. The blood is then tested to be sure it is compatible with you in order to minimize the chance of a transfusion reaction. If you or a relative donates blood, this is often done in anticipation of surgery and is not appropriate for emergency situations. It takes many days to process the donated blood. RISKS AND COMPLICATIONS Although transfusion therapy is very safe and saves many lives, the main dangers of transfusion include:   Getting an infectious disease.  Developing a transfusion reaction. This is an allergic reaction to something in the blood you were given. Every precaution is taken to prevent this. The decision to have a blood transfusion has been considered carefully by your caregiver before blood is given. Blood is not given unless the benefits outweigh the risks. AFTER THE TRANSFUSION  Right after receiving a blood transfusion, you will usually feel much better and more energetic. This is especially true if your red blood  cells have gotten low (anemic). The transfusion raises the level of the red blood cells which carry oxygen, and this usually causes an energy increase.  The nurse administering the transfusion will monitor you carefully for complications. HOME CARE INSTRUCTIONS  No special instructions are needed after a transfusion. You may find your energy is better. Speak with your caregiver about any limitations on activity for underlying diseases you may have. SEEK MEDICAL CARE IF:   Your condition is not improving after your transfusion.  You develop redness or irritation at the intravenous (IV) site. SEEK IMMEDIATE MEDICAL CARE IF:  Any of the following  symptoms occur over the next 12 hours:  Shaking chills.  You have a temperature by mouth above 102 F (38.9 C), not controlled by medicine.  Chest, back, or muscle pain.  People around you feel you are not acting correctly or are confused.  Shortness of breath or difficulty breathing.  Dizziness and fainting.  You get a rash or develop hives.  You have a decrease in urine output.  Your urine turns a dark color or changes to pink, red, or brown. Any of the following symptoms occur over the next 10 days:  You have a temperature by mouth above 102 F (38.9 C), not controlled by medicine.  Shortness of breath.  Weakness after normal activity.  The white part of the eye turns yellow (jaundice).  You have a decrease in the amount of urine or are urinating less often.  Your urine turns a dark color or changes to pink, red, or brown. Document Released: 04/21/2000 Document Revised: 07/17/2011 Document Reviewed: 12/09/2007 [facility] Patient Information 2014 [place], [place].  _______________________________________________________________________

## 2018-02-15 NOTE — Progress Notes (Addendum)
Clearance Dr Joycelyn Rua 01-31-2018 on chart

## 2018-02-18 ENCOUNTER — Encounter (HOSPITAL_COMMUNITY)
Admission: RE | Admit: 2018-02-18 | Discharge: 2018-02-18 | Disposition: A | Discharge status: Home / Self Care | Payer: 59 | Source: Ambulatory Visit | Attending: Orthopedic Surgery | Admitting: Orthopedic Surgery

## 2018-02-18 ENCOUNTER — Encounter (HOSPITAL_COMMUNITY): Payer: Self-pay

## 2018-02-18 ENCOUNTER — Encounter (INDEPENDENT_AMBULATORY_CARE_PROVIDER_SITE_OTHER): Payer: Self-pay

## 2018-02-18 ENCOUNTER — Other Ambulatory Visit: Payer: Self-pay

## 2018-02-18 DIAGNOSIS — Z01818 Encounter for other preprocedural examination: Secondary | ICD-10-CM | POA: Diagnosis present

## 2018-02-18 DIAGNOSIS — I10 Essential (primary) hypertension: Secondary | ICD-10-CM | POA: Insufficient documentation

## 2018-02-18 HISTORY — DX: Bradycardia, unspecified: R00.1

## 2018-02-18 HISTORY — DX: Dizziness and giddiness: R42

## 2018-02-18 HISTORY — DX: Paroxysmal atrial fibrillation: I48.0

## 2018-02-18 LAB — ABO/RH: ABO/RH(D): O POS

## 2018-02-18 LAB — SURGICAL PCR SCREEN
MRSA, PCR: NEGATIVE
STAPHYLOCOCCUS AUREUS: NEGATIVE

## 2018-02-18 NOTE — Progress Notes (Signed)
EKG 01-31-18 on chart from [facility] physicians   Hgba1c, cbcdiff, cmp 01-31-18 on chart from [facility] physicians

## 2018-02-18 NOTE — H&P (Signed)
TOTAL HIP ADMISSION H&P  Patient is admitted for right total hip arthroplasty, anterior approach.  Subjective:  Chief Complaint:   Right hip primary OA / pain  HPI: Deborah Baldwin, 57 y.o. female, has a history of pain and functional disability in the right hip(s) due to arthritis and patient has failed non-surgical conservative treatments for greater than 12 weeks to include NSAID's and/or analgesics, corticosteriod injections and activity modification.  Onset of symptoms was gradual starting 2.5 years ago with gradually worsening course since that time.The patient noted no past surgery on the right hip(s).  Patient currently rates pain in the right hip at 9 out of 10 with activity. Patient has night pain, worsening of pain with activity and weight bearing, trendelenberg gait, pain that interfers with activities of daily living and pain with passive range of motion. Patient has evidence of periarticular osteophytes and joint space narrowing by imaging studies. This condition presents safety issues increasing the risk of falls.  There is no current active infection.  Risks, benefits and expectations were discussed with the patient.  Risks including but not limited to the risk of anesthesia, blood clots, nerve damage, blood vessel damage, failure of the prosthesis, infection and up to and including death.  Patient understand the risks, benefits and expectations and wishes to proceed with surgery.    PCP: Joycelyn Rua, MD  D/C Plans:       Home   Post-op Meds:       No Rx given  Tranexamic Acid:      To be given - IV   Decadron:      Is to be given  FYI:      ASA  Norco  DME:   Rx given for - RW & 3-n-1  PT:   No PT    Patient Active Problem List   Diagnosis Date Noted  . PAF (paroxysmal atrial fibrillation) (HCC) 09/03/2013  . Chronic anticoagulation 09/03/2013  . Dizziness 07/30/2013  . Morbid obesity (HCC) 07/30/2013  . hypertension   . Obesity    Past Medical History:   Diagnosis Date  . Arthritis    , Subtolar  . Hyperlipidemia   . hypertension   . Obesity   . Rosacea     Past Surgical History:  Procedure Laterality Date  . COLONOSCOPY    . endoablatin    . TONSILLECTOMY      No current facility-administered medications for this encounter.    Current Outpatient Medications  Medication Sig Dispense Refill Last Dose  . atorvastatin (LIPITOR) 20 MG tablet Take 20 mg by mouth daily.  5   . famotidine (PEPCID) 10 MG tablet Take 10 mg by mouth 2 (two) times daily.     Marland Kitchen ibuprofen (ADVIL,MOTRIN) 800 MG tablet Take 800 mg by mouth 2 (two) times daily.     Marland Kitchen lisinopril-hydrochlorothiazide (PRINZIDE,ZESTORETIC) 20-12.5 MG per tablet Take 1 tablet by mouth daily. 30 tablet 11    Allergies  Allergen Reactions  . Minocin [Minocycline Hcl] Hives  . Tetracyclines & Related Hives    Social History   Tobacco Use  . Smoking status: Never Smoker  Substance Use Topics  . Alcohol use: Yes    Alcohol/week: 10.0 standard drinks    Types: 10 drink(s) per week    Comment: 1-2  daily    Family History  Problem Relation Age of Onset  . Prostate cancer Father   . Hypertension Father   . Hypertension Mother   . Colon polyps Mother   .  Diverticulitis Mother   . Heart disease Maternal Grandfather   . Colon cancer Maternal Grandfather   . Dementia Paternal Grandfather   . Hypertension Paternal Grandmother   . Breast cancer Neg Hx      Review of Systems  Constitutional: Negative.   HENT: Negative.   Eyes: Negative.   Respiratory: Negative.   Cardiovascular: Negative.   Gastrointestinal: Negative.   Genitourinary: Negative.   Musculoskeletal: Positive for joint pain.  Skin: Negative.   Neurological: Negative.   Endo/Heme/Allergies: Negative.   Psychiatric/Behavioral: Negative.     Objective:  Physical Exam  Constitutional: She is oriented to person, place, and time. She appears well-developed.  HENT:  Head: Normocephalic.  Eyes: Pupils are  equal, round, and reactive to light.  Neck: Neck supple. No JVD present. No tracheal deviation present. No thyromegaly present.  Cardiovascular: Normal rate, regular rhythm and intact distal pulses.  Respiratory: Effort normal and breath sounds normal. No respiratory distress. She has no wheezes.  GI: Soft. There is no tenderness. There is no guarding.  Musculoskeletal:       Right hip: She exhibits decreased range of motion, decreased strength, tenderness and bony tenderness. She exhibits no swelling, no deformity and no laceration.  Lymphadenopathy:    She has no cervical adenopathy.  Neurological: She is alert and oriented to person, place, and time.  Skin: Skin is warm and dry.  Psychiatric: She has a normal mood and affect.     Labs:  Estimated body mass index is 43.55 kg/m as calculated from the following:   Height as of 01/15/15: 5\' 6"  (1.676 m).   Weight as of 01/15/15: 122.4 kg.   Imaging Review Plain radiographs demonstrate severe degenerative joint disease of the right hip. The bone quality appears to be good for age and reported activity level.    Preoperative templating of the joint replacement has been completed, documented, and submitted to the Operating Room personnel in order to optimize intra-operative equipment management.     Assessment/Plan:  End stage arthritis, right hip  The patient history, physical examination, clinical judgement of the provider and imaging studies are consistent with end stage degenerative joint disease of the right hip and total hip arthroplasty is deemed medically necessary. The treatment options including medical management, injection therapy, arthroscopy and arthroplasty were discussed at length. The risks and benefits of total hip arthroplasty were presented and reviewed. The risks due to aseptic loosening, infection, stiffness, dislocation/subluxation,  thromboembolic complications and other imponderables were discussed.  The patient  acknowledged the explanation, agreed to proceed with the plan and consent was signed. Patient is being admitted for inpatient treatment for surgery, pain control, PT, OT, prophylactic antibiotics, VTE prophylaxis, progressive ambulation and ADL's and discharge planning.The patient is planning to be discharged home.        Anastasio Auerbach Quintasia Theroux   PA-C  02/18/2018, 8:12 AM

## 2018-02-25 NOTE — Anesthesia Preprocedure Evaluation (Addendum)
Anesthesia Evaluation  Patient identified by MRN, date of birth, ID band Patient awake    Reviewed: Allergy & Precautions, NPO status , Patient's Chart, lab work & pertinent test results  History of Anesthesia Complications Negative for: history of anesthetic complications  Airway Mallampati: III  TM Distance: >3 FB Neck ROM: Full    Dental no notable dental hx.    Pulmonary neg pulmonary ROS,    Pulmonary exam normal        Cardiovascular hypertension, Pt. on medications Normal cardiovascular exam+ dysrhythmias (had a single brief episode of A fib found on holter monitor- currently on no tx or AC) Atrial Fibrillation   Unremarkable echo 08/26/13   Neuro/Psych negative neurological ROS  negative psych ROS   GI/Hepatic Neg liver ROS, GERD  Medicated,  Endo/Other  negative endocrine ROS  Renal/GU negative Renal ROS  negative genitourinary   Musculoskeletal  (+) Arthritis , Osteoarthritis,    Abdominal (+) + obese,   Peds  Hematology negative hematology ROS (+)   Anesthesia Other Findings   Reproductive/Obstetrics                            Anesthesia Physical Anesthesia Plan  ASA: III  Anesthesia Plan: Spinal   Post-op Pain Management:    Induction:   PONV Risk Score and Plan: 2 and Propofol infusion  Airway Management Planned: Natural Airway, Nasal Cannula and Simple Face Mask  Additional Equipment: None  Intra-op Plan:   Post-operative Plan:   Informed Consent: I have reviewed the patients History and Physical, chart, labs and discussed the procedure including the risks, benefits and alternatives for the proposed anesthesia with the patient or authorized representative who has indicated his/her understanding and acceptance.     Plan Discussed with:   Anesthesia Plan Comments:        Anesthesia Quick Evaluation

## 2018-02-26 ENCOUNTER — Inpatient Hospital Stay (HOSPITAL_COMMUNITY)
Admission: RE | Admit: 2018-02-26 | Discharge: 2018-02-27 | DRG: 470 | Disposition: A | Discharge status: Home / Self Care | Payer: 59 | Attending: Orthopedic Surgery | Admitting: Orthopedic Surgery

## 2018-02-26 ENCOUNTER — Other Ambulatory Visit: Payer: Self-pay

## 2018-02-26 ENCOUNTER — Encounter (HOSPITAL_COMMUNITY)
Admission: RE | Disposition: A | Discharge status: Home / Self Care | Payer: Self-pay | Source: Home / Self Care | Attending: Orthopedic Surgery

## 2018-02-26 ENCOUNTER — Inpatient Hospital Stay (HOSPITAL_COMMUNITY): Payer: 59 | Admitting: Anesthesiology

## 2018-02-26 ENCOUNTER — Inpatient Hospital Stay (HOSPITAL_COMMUNITY): Payer: 59

## 2018-02-26 ENCOUNTER — Encounter (HOSPITAL_COMMUNITY): Payer: Self-pay | Admitting: *Deleted

## 2018-02-26 DIAGNOSIS — Z96649 Presence of unspecified artificial hip joint: Secondary | ICD-10-CM

## 2018-02-26 DIAGNOSIS — Z6841 Body Mass Index (BMI) 40.0 and over, adult: Secondary | ICD-10-CM

## 2018-02-26 DIAGNOSIS — I10 Essential (primary) hypertension: Secondary | ICD-10-CM | POA: Diagnosis present

## 2018-02-26 DIAGNOSIS — E785 Hyperlipidemia, unspecified: Secondary | ICD-10-CM | POA: Diagnosis present

## 2018-02-26 DIAGNOSIS — Z419 Encounter for procedure for purposes other than remedying health state, unspecified: Secondary | ICD-10-CM

## 2018-02-26 DIAGNOSIS — M1611 Unilateral primary osteoarthritis, right hip: Secondary | ICD-10-CM | POA: Diagnosis not present

## 2018-02-26 DIAGNOSIS — K219 Gastro-esophageal reflux disease without esophagitis: Secondary | ICD-10-CM | POA: Diagnosis present

## 2018-02-26 DIAGNOSIS — Z96641 Presence of right artificial hip joint: Secondary | ICD-10-CM

## 2018-02-26 HISTORY — PX: TOTAL HIP ARTHROPLASTY: SHX124

## 2018-02-26 LAB — TYPE AND SCREEN
ABO/RH(D): O POS
Antibody Screen: NEGATIVE

## 2018-02-26 SURGERY — ARTHROPLASTY, HIP, TOTAL, ANTERIOR APPROACH
Anesthesia: Spinal | Site: Hip | Laterality: Right

## 2018-02-26 MED ORDER — POLYETHYLENE GLYCOL 3350 17 G PO PACK: 17.0000 g | PACK | Freq: Two times a day (BID) | ORAL | 0 refills | Status: DC

## 2018-02-26 MED ORDER — ONDANSETRON HCL 4 MG/2ML IJ SOLN: 4.0000 mg | Freq: Four times a day (QID) | INTRAMUSCULAR | Status: DC | PRN

## 2018-02-26 MED ORDER — FERROUS SULFATE 325 (65 FE) MG PO TABS: 325.0000 mg | ORAL_TABLET | Freq: Three times a day (TID) | ORAL | 3 refills | Status: DC

## 2018-02-26 MED ORDER — PHENYLEPHRINE 40 MCG/ML (10ML) SYRINGE FOR IV PUSH (FOR BLOOD PRESSURE SUPPORT)
PREFILLED_SYRINGE | INTRAVENOUS | Status: AC
  Filled 2018-02-26: qty 10

## 2018-02-26 MED ORDER — POLYETHYLENE GLYCOL 3350 17 G PO PACK
17.0000 g | PACK | Freq: Two times a day (BID) | ORAL | Status: DC
  Administered 2018-02-26 – 2018-02-27 (×2): 17 g via ORAL
  Filled 2018-02-26 (×2): qty 1

## 2018-02-26 MED ORDER — FERROUS SULFATE 325 (65 FE) MG PO TABS
325.0000 mg | ORAL_TABLET | Freq: Three times a day (TID) | ORAL | Status: DC
  Administered 2018-02-27: 325 mg via ORAL
  Filled 2018-02-26: qty 1

## 2018-02-26 MED ORDER — PROPOFOL 10 MG/ML IV BOLUS: INTRAVENOUS | Status: DC | PRN

## 2018-02-26 MED ORDER — PHENYLEPHRINE 40 MCG/ML (10ML) SYRINGE FOR IV PUSH (FOR BLOOD PRESSURE SUPPORT)
PREFILLED_SYRINGE | INTRAVENOUS | Status: DC | PRN
  Administered 2018-02-26: 40 ug via INTRAVENOUS
  Administered 2018-02-26: 80 ug via INTRAVENOUS
  Administered 2018-02-26 (×2): 40 ug via INTRAVENOUS
  Administered 2018-02-26: 80 ug via INTRAVENOUS
  Administered 2018-02-26 (×10): 40 ug via INTRAVENOUS
  Administered 2018-02-26: 80 ug via INTRAVENOUS
  Administered 2018-02-26: 40 ug via INTRAVENOUS

## 2018-02-26 MED ORDER — METHOCARBAMOL 500 MG IVPB - SIMPLE MED
500.0000 mg | Freq: Four times a day (QID) | INTRAVENOUS | Status: DC | PRN
  Filled 2018-02-26: qty 50

## 2018-02-26 MED ORDER — PROPOFOL 500 MG/50ML IV EMUL
INTRAVENOUS | Status: DC | PRN
  Administered 2018-02-26: 75 ug/kg/min via INTRAVENOUS

## 2018-02-26 MED ORDER — ATORVASTATIN CALCIUM 20 MG PO TABS
20.0000 mg | ORAL_TABLET | Freq: Every day | ORAL | Status: DC
  Administered 2018-02-27: 20 mg via ORAL
  Filled 2018-02-26: qty 1

## 2018-02-26 MED ORDER — PROPOFOL 10 MG/ML IV BOLUS
INTRAVENOUS | Status: AC
  Filled 2018-02-26: qty 20

## 2018-02-26 MED ORDER — ACETAMINOPHEN 325 MG PO TABS: 325.0000 mg | ORAL_TABLET | Freq: Four times a day (QID) | ORAL | Status: DC | PRN

## 2018-02-26 MED ORDER — ONDANSETRON HCL 4 MG/2ML IJ SOLN: 4.0000 mg | Freq: Once | INTRAMUSCULAR | Status: DC | PRN

## 2018-02-26 MED ORDER — TRANEXAMIC ACID-NACL 1000-0.7 MG/100ML-% IV SOLN
1000.0000 mg | INTRAVENOUS | Status: AC
  Administered 2018-02-26: 1000 mg via INTRAVENOUS
  Filled 2018-02-26: qty 100

## 2018-02-26 MED ORDER — ONDANSETRON HCL 4 MG/2ML IJ SOLN
INTRAMUSCULAR | Status: DC | PRN
  Administered 2018-02-26: 4 mg via INTRAVENOUS

## 2018-02-26 MED ORDER — OXYCODONE HCL 5 MG/5ML PO SOLN
5.0000 mg | Freq: Once | ORAL | Status: DC | PRN
  Filled 2018-02-26: qty 5

## 2018-02-26 MED ORDER — DOCUSATE SODIUM 100 MG PO CAPS: 100.0000 mg | ORAL_CAPSULE | Freq: Two times a day (BID) | ORAL | 0 refills | Status: DC

## 2018-02-26 MED ORDER — DEXAMETHASONE SODIUM PHOSPHATE 10 MG/ML IJ SOLN
INTRAMUSCULAR | Status: DC | PRN
  Administered 2018-02-26: 10 mg via INTRAVENOUS

## 2018-02-26 MED ORDER — CHLORHEXIDINE GLUCONATE 4 % EX LIQD: 60.0000 mL | Freq: Once | CUTANEOUS | Status: DC

## 2018-02-26 MED ORDER — TRANEXAMIC ACID-NACL 1000-0.7 MG/100ML-% IV SOLN
1000.0000 mg | Freq: Once | INTRAVENOUS | Status: AC
  Administered 2018-02-26: 1000 mg via INTRAVENOUS
  Filled 2018-02-26: qty 100

## 2018-02-26 MED ORDER — LACTATED RINGERS IV SOLN
INTRAVENOUS | Status: DC
  Administered 2018-02-26 (×2): via INTRAVENOUS

## 2018-02-26 MED ORDER — CEFAZOLIN SODIUM-DEXTROSE 2-4 GM/100ML-% IV SOLN
2.0000 g | INTRAVENOUS | Status: AC
  Administered 2018-02-26: 2 g via INTRAVENOUS
  Filled 2018-02-26: qty 100

## 2018-02-26 MED ORDER — BUPIVACAINE IN DEXTROSE 0.75-8.25 % IT SOLN
INTRATHECAL | Status: DC | PRN
  Administered 2018-02-26: 2 mL via INTRATHECAL

## 2018-02-26 MED ORDER — DIPHENHYDRAMINE HCL 12.5 MG/5ML PO ELIX: 12.5000 mg | ORAL_SOLUTION | ORAL | Status: DC | PRN

## 2018-02-26 MED ORDER — PROPOFOL 10 MG/ML IV BOLUS
INTRAVENOUS | Status: AC
  Filled 2018-02-26: qty 40

## 2018-02-26 MED ORDER — ONDANSETRON HCL 4 MG PO TABS: 4.0000 mg | ORAL_TABLET | Freq: Four times a day (QID) | ORAL | Status: DC | PRN

## 2018-02-26 MED ORDER — MIDAZOLAM HCL 5 MG/5ML IJ SOLN
INTRAMUSCULAR | Status: DC | PRN
  Administered 2018-02-26: 2 mg via INTRAVENOUS

## 2018-02-26 MED ORDER — DEXAMETHASONE SODIUM PHOSPHATE 10 MG/ML IJ SOLN
10.0000 mg | Freq: Once | INTRAMUSCULAR | Status: AC
  Administered 2018-02-27: 10 mg via INTRAVENOUS
  Filled 2018-02-26: qty 1

## 2018-02-26 MED ORDER — DOCUSATE SODIUM 100 MG PO CAPS
100.0000 mg | ORAL_CAPSULE | Freq: Two times a day (BID) | ORAL | Status: DC
  Administered 2018-02-26 – 2018-02-27 (×2): 100 mg via ORAL
  Filled 2018-02-26 (×2): qty 1

## 2018-02-26 MED ORDER — ALUM & MAG HYDROXIDE-SIMETH 200-200-20 MG/5ML PO SUSP: 15.0000 mL | ORAL | Status: DC | PRN

## 2018-02-26 MED ORDER — METHOCARBAMOL 500 MG PO TABS: 500.0000 mg | ORAL_TABLET | Freq: Four times a day (QID) | ORAL | 0 refills | Status: DC | PRN

## 2018-02-26 MED ORDER — HYDROCODONE-ACETAMINOPHEN 7.5-325 MG PO TABS: 1.0000 | ORAL_TABLET | ORAL | 0 refills | Status: DC | PRN

## 2018-02-26 MED ORDER — METOCLOPRAMIDE HCL 5 MG/ML IJ SOLN: 5.0000 mg | Freq: Three times a day (TID) | INTRAMUSCULAR | Status: DC | PRN

## 2018-02-26 MED ORDER — ONDANSETRON HCL 4 MG/2ML IJ SOLN
INTRAMUSCULAR | Status: AC
  Filled 2018-02-26: qty 2

## 2018-02-26 MED ORDER — FENTANYL CITRATE (PF) 100 MCG/2ML IJ SOLN
INTRAMUSCULAR | Status: DC | PRN
  Administered 2018-02-26 (×2): 50 ug via INTRAVENOUS

## 2018-02-26 MED ORDER — DEXAMETHASONE SODIUM PHOSPHATE 10 MG/ML IJ SOLN: 10.0000 mg | Freq: Once | INTRAMUSCULAR | Status: DC

## 2018-02-26 MED ORDER — DEXAMETHASONE SODIUM PHOSPHATE 10 MG/ML IJ SOLN
INTRAMUSCULAR | Status: AC
  Filled 2018-02-26: qty 1

## 2018-02-26 MED ORDER — METHOCARBAMOL 500 MG PO TABS
500.0000 mg | ORAL_TABLET | Freq: Four times a day (QID) | ORAL | Status: DC | PRN
  Administered 2018-02-27: 500 mg via ORAL
  Filled 2018-02-26: qty 1

## 2018-02-26 MED ORDER — HYDROMORPHONE HCL 1 MG/ML IJ SOLN: 0.5000 mg | INTRAMUSCULAR | Status: DC | PRN

## 2018-02-26 MED ORDER — OXYCODONE HCL 5 MG PO TABS: 5.0000 mg | ORAL_TABLET | Freq: Once | ORAL | Status: DC | PRN

## 2018-02-26 MED ORDER — LIDOCAINE 2% (20 MG/ML) 5 ML SYRINGE
INTRAMUSCULAR | Status: AC
  Filled 2018-02-26: qty 5

## 2018-02-26 MED ORDER — ASPIRIN 81 MG PO CHEW: 81.0000 mg | CHEWABLE_TABLET | Freq: Two times a day (BID) | ORAL | 0 refills | Status: AC

## 2018-02-26 MED ORDER — LIDOCAINE 2% (20 MG/ML) 5 ML SYRINGE
INTRAMUSCULAR | Status: DC | PRN
  Administered 2018-02-26: 60 mg via INTRAVENOUS

## 2018-02-26 MED ORDER — PHENOL 1.4 % MT LIQD: 1.0000 | OROMUCOSAL | Status: DC | PRN

## 2018-02-26 MED ORDER — CEFAZOLIN SODIUM-DEXTROSE 2-4 GM/100ML-% IV SOLN
2.0000 g | Freq: Four times a day (QID) | INTRAVENOUS | Status: AC
  Administered 2018-02-26 (×2): 2 g via INTRAVENOUS
  Filled 2018-02-26 (×2): qty 100

## 2018-02-26 MED ORDER — FENTANYL CITRATE (PF) 100 MCG/2ML IJ SOLN
INTRAMUSCULAR | Status: AC
  Filled 2018-02-26: qty 2

## 2018-02-26 MED ORDER — SODIUM CHLORIDE 0.9 % IV SOLN
INTRAVENOUS | Status: DC
  Administered 2018-02-26 – 2018-02-27 (×2): via INTRAVENOUS

## 2018-02-26 MED ORDER — CELECOXIB 200 MG PO CAPS
200.0000 mg | ORAL_CAPSULE | Freq: Two times a day (BID) | ORAL | Status: DC
  Administered 2018-02-26 – 2018-02-27 (×2): 200 mg via ORAL
  Filled 2018-02-26 (×2): qty 1

## 2018-02-26 MED ORDER — FENTANYL CITRATE (PF) 100 MCG/2ML IJ SOLN: 25.0000 ug | INTRAMUSCULAR | Status: DC | PRN

## 2018-02-26 MED ORDER — MIDAZOLAM HCL 2 MG/2ML IJ SOLN
INTRAMUSCULAR | Status: AC
  Filled 2018-02-26: qty 2

## 2018-02-26 MED ORDER — MENTHOL 3 MG MT LOZG: 1.0000 | LOZENGE | OROMUCOSAL | Status: DC | PRN

## 2018-02-26 MED ORDER — HYDROCODONE-ACETAMINOPHEN 5-325 MG PO TABS
1.0000 | ORAL_TABLET | ORAL | Status: DC | PRN
  Administered 2018-02-26 (×2): 1 via ORAL
  Administered 2018-02-26 – 2018-02-27 (×3): 2 via ORAL
  Administered 2018-02-27: 1 via ORAL
  Filled 2018-02-26: qty 1
  Filled 2018-02-26 (×3): qty 2
  Filled 2018-02-26 (×2): qty 1

## 2018-02-26 MED ORDER — BISACODYL 10 MG RE SUPP: 10.0000 mg | Freq: Every day | RECTAL | Status: DC | PRN

## 2018-02-26 MED ORDER — FAMOTIDINE 20 MG PO TABS
10.0000 mg | ORAL_TABLET | Freq: Two times a day (BID) | ORAL | Status: DC
  Administered 2018-02-27: 10 mg via ORAL
  Filled 2018-02-26 (×2): qty 1

## 2018-02-26 MED ORDER — MAGNESIUM CITRATE PO SOLN: 1.0000 | Freq: Once | ORAL | Status: DC | PRN

## 2018-02-26 MED ORDER — ASPIRIN 81 MG PO CHEW
81.0000 mg | CHEWABLE_TABLET | Freq: Two times a day (BID) | ORAL | Status: DC
  Administered 2018-02-26 – 2018-02-27 (×2): 81 mg via ORAL
  Filled 2018-02-26 (×2): qty 1

## 2018-02-26 MED ORDER — METOCLOPRAMIDE HCL 5 MG PO TABS: 5.0000 mg | ORAL_TABLET | Freq: Three times a day (TID) | ORAL | Status: DC | PRN

## 2018-02-26 MED ORDER — HYDROCODONE-ACETAMINOPHEN 7.5-325 MG PO TABS: 1.0000 | ORAL_TABLET | ORAL | Status: DC | PRN

## 2018-02-26 MED ORDER — STERILE WATER FOR IRRIGATION IR SOLN
Status: DC | PRN
  Administered 2018-02-26: 2000 mL

## 2018-02-26 SURGICAL SUPPLY — 42 items
BAG DECANTER FOR FLEXI CONT (MISCELLANEOUS) IMPLANT
BAG ZIPLOCK 12X15 (MISCELLANEOUS) IMPLANT
BLADE SAG 18X100X1.27 (BLADE) ×3 IMPLANT
COVER PERINEAL POST (MISCELLANEOUS) ×3 IMPLANT
COVER SURGICAL LIGHT HANDLE (MISCELLANEOUS) ×3 IMPLANT
COVER WAND RF STERILE (DRAPES) ×3 IMPLANT
CUP ACETBLR 52 OD PINNACLE (Hips) ×3 IMPLANT
DERMABOND ADVANCED (GAUZE/BANDAGES/DRESSINGS) ×2
DERMABOND ADVANCED .7 DNX12 (GAUZE/BANDAGES/DRESSINGS) ×1 IMPLANT
DRAPE STERI IOBAN 125X83 (DRAPES) ×3 IMPLANT
DRAPE U-SHAPE 47X51 STRL (DRAPES) ×6 IMPLANT
DRESSING AQUACEL AG SP 3.5X10 (GAUZE/BANDAGES/DRESSINGS) ×1 IMPLANT
DRSG AQUACEL AG SP 3.5X10 (GAUZE/BANDAGES/DRESSINGS) ×3
DURAPREP 26ML APPLICATOR (WOUND CARE) ×3 IMPLANT
ELECT REM PT RETURN 15FT ADLT (MISCELLANEOUS) ×3 IMPLANT
ELIMINATOR HOLE APEX DEPUY (Hips) ×3 IMPLANT
GLOVE BIOGEL M STRL SZ7.5 (GLOVE) ×6 IMPLANT
GLOVE BIOGEL PI IND STRL 7.5 (GLOVE) ×2 IMPLANT
GLOVE BIOGEL PI IND STRL 8.5 (GLOVE) IMPLANT
GLOVE BIOGEL PI INDICATOR 7.5 (GLOVE) ×4
GLOVE BIOGEL PI INDICATOR 8.5 (GLOVE)
GLOVE ECLIPSE 8.0 STRL XLNG CF (GLOVE) IMPLANT
GLOVE ORTHO TXT STRL SZ7.5 (GLOVE) ×3 IMPLANT
GOWN STRL REUS W/TWL 2XL LVL3 (GOWN DISPOSABLE) IMPLANT
GOWN STRL REUS W/TWL LRG LVL3 (GOWN DISPOSABLE) ×3 IMPLANT
HEAD CERAMIC DELTA 36 PLUS 1.5 (Hips) ×3 IMPLANT
HOLDER FOLEY CATH W/STRAP (MISCELLANEOUS) ×3 IMPLANT
LINER NEUTRAL 52X36MM PLUS 4 (Liner) ×3 IMPLANT
PACK ANTERIOR HIP CUSTOM (KITS) ×3 IMPLANT
SCREW 6.5MMX25MM (Screw) ×3 IMPLANT
STEM TRI LOC BPS GRIPTON SZ 2 ×1 IMPLANT
SUT MNCRL AB 4-0 PS2 18 (SUTURE) ×3 IMPLANT
SUT STRATAFIX 0 PDS 27 VIOLET (SUTURE) ×3
SUT VIC AB 1 CT1 36 (SUTURE) ×9 IMPLANT
SUT VIC AB 2-0 CT1 27 (SUTURE) ×4
SUT VIC AB 2-0 CT1 TAPERPNT 27 (SUTURE) ×2 IMPLANT
SUTURE STRATFX 0 PDS 27 VIOLET (SUTURE) ×1 IMPLANT
TRAY FOLEY CATH 14FRSI W/METER (CATHETERS) ×3 IMPLANT
TRAY FOLEY MTR SLVR 16FR STAT (SET/KITS/TRAYS/PACK) IMPLANT
TRI LOC BPS W GRIPTON SZ 2 ×3 IMPLANT
WATER STERILE IRR 1000ML POUR (IV SOLUTION) ×3 IMPLANT
YANKAUER SUCT BULB TIP 10FT TU (MISCELLANEOUS) IMPLANT

## 2018-02-26 NOTE — Discharge Instructions (Signed)

## 2018-02-26 NOTE — Anesthesia Postprocedure Evaluation (Signed)
Anesthesia Post Note  Patient: Deborah Baldwin  Procedure(s) Performed: RIGHT TOTAL HIP ARTHROPLASTY ANTERIOR APPROACH (Right Hip)     Patient location during evaluation: PACU Anesthesia Type: Spinal Level of consciousness: oriented and awake and alert Pain management: pain level controlled Vital Signs Assessment: post-procedure vital signs reviewed and stable Respiratory status: spontaneous breathing, respiratory function stable and patient connected to nasal cannula oxygen Cardiovascular status: blood pressure returned to baseline and stable Postop Assessment: no headache, no backache, no apparent nausea or vomiting and spinal receding Anesthetic complications: no    Last Vitals:  Vitals:   02/26/18 1438 02/26/18 1602  BP: (!) 149/90 (!) 132/93  Pulse: 78 81  Resp: 16 16  Temp: 36.7 C 36.6 C  SpO2: 100% 98%    Last Pain:  Vitals:   02/26/18 1602  TempSrc: Oral  PainSc:                  Lucretia Kern

## 2018-02-26 NOTE — Op Note (Signed)
NAME:  Deborah Baldwin                ACCOUNT NO.: [id number]      MEDICAL RECORD NO.: [id number]      FACILITY:  [facility]      PHYSICIAN:  [name]  DATE OF BIRTH:  09-07-1960     DATE OF PROCEDURE:  02/26/2018                                 OPERATIVE REPORT         PREOPERATIVE DIAGNOSIS: Right  hip osteoarthritis.      POSTOPERATIVE DIAGNOSIS:  Right hip osteoarthritis.      PROCEDURE:  Right total hip replacement through an anterior approach   utilizing DePuy THR system, component size 52mm pinnacle cup, a size 36+4 neutral   Altrex liner, a size 2 Hi Tri Lock stem with a 36+1.5 delta ceramic   ball.      SURGEON:  Madlyn Frankel. Charlann Boxer, M.D.      ASSISTANT:  Skip Mayer, PA-C     ANESTHESIA:  Spinal.      SPECIMENS:  None.      COMPLICATIONS:  None.      BLOOD LOSS:  500 cc     DRAINS:  None.      INDICATION OF THE PROCEDURE:  Deborah Baldwin is a 57 y.o. female who had   presented to office for evaluation of right hip pain.  Radiographs revealed   progressive degenerative changes with bone-on-bone   articulation of the  hip joint, including subchondral cystic changes and osteophytes.  The patient had painful limited range of   motion significantly affecting their overall quality of life and function.  The patient was failing to    respond to conservative measures including medications and/or injections and activity modification and at this point was ready   to proceed with more definitive measures.  Consent was obtained for   benefit of pain relief.  Specific risks of infection, DVT, component   failure, dislocation, neurovascular injury, and need for revision surgery were reviewed in the office as well discussion of   the anterior versus posterior approach were reviewed.     PROCEDURE IN DETAIL:  The patient was brought to operative theater.   Once adequate anesthesia, preoperative antibiotics, 2 gm of Ancef, 1 gm of Tranexamic Acid, and  10 mg of Decadron were administered, the patient was positioned supine on the [employer] table.  Once the patient was safely positioned with adequate padding of boney prominences we predraped out the hip, and used fluoroscopy to confirm orientation of the pelvis.      The right hip was then prepped and draped from proximal iliac crest to   mid thigh with a shower curtain technique.      Time-out was performed identifying the patient, planned procedure, and the appropriate extremity.     An incision was then made 2 cm lateral to the   anterior superior iliac spine extending over the orientation of the   tensor fascia lata muscle and sharp dissection was carried down to the   fascia of the muscle.      The fascia was then incised.  The muscle belly was identified and swept   laterally and retractor placed along the superior neck.  Following   cauterization of the circumflex vessels and removing some pericapsular  fat, a second cobra retractor was placed on the inferior neck.  A T-capsulotomy was made along the line of the   superior neck to the trochanteric fossa, then extended proximally and   distally.  Tag sutures were placed and the retractors were then placed   intracapsular.  We then identified the trochanteric fossa and   orientation of my neck cut and then made a neck osteotomy with the femur on traction.  The femoral   head was removed without difficulty or complication.  Traction was let   off and retractors were placed posterior and anterior around the   acetabulum.      The labrum and foveal tissue were debrided.  I began reaming with a 44 mm   reamer and reamed up to 51 mm reamer with good bony bed preparation and a 52 mm  cup was chosen.  The final 52 mm Pinnacle cup was then impacted under fluoroscopy to confirm the depth of penetration and orientation with respect to   Abduction and forward flexion.  A screw was placed into the ilium followed by the hole eliminator.  The  final   36+4 neutral Altrex liner was impacted with good visualized rim fit.  The cup was positioned anatomically within the acetabular portion of the pelvis.      At this point, the femur was rolled to 100 degrees.  Further capsule was   released off the inferior aspect of the femoral neck.  I then   released the superior capsule proximally.  With the leg in a neutral position the hook was placed laterally   along the femur under the vastus lateralis origin and elevated manually and then held in position using the hook attachment on the bed.  The leg was then extended and adducted with the leg rolled to 100   degrees of external rotation.  Retractors were placed along the medial calcar and posteriorly over the greater trochanter.  Once the proximal femur was fully   exposed, I used a box osteotome to set orientation.  I then began   broaching with the starting chili pepper broach and passed this by hand and then broached up to 2.  With the 2 broach in place I chose a high offset neck and did several trial reductions.  The offset was appropriate, leg lengths   appeared to be equal best matched with the +1.5 head ball trial confirmed radiographically.   Given these findings, I went ahead and dislocated the hip, repositioned all   retractors and positioned the right hip in the extended and abducted position.  The final 2 Hi Tri Lock stem was   chosen and it was impacted down to the level of neck cut.  Based on this   and the trial reductions, a final 36+1.5 delta ceramic ball was chosen and   impacted onto a clean and dry trunnion, and the hip was reduced.  The   hip had been irrigated throughout the case again at this point.  I did   reapproximate the superior capsular leaflet to the anterior leaflet   using #1 Vicryl.  The fascia of the   tensor fascia lata muscle was then reapproximated using #1 Vicryl and #0 Stratafix sutures.  The   remaining wound was closed with 2-0 Vicryl and running 4-0  Monocryl.   The hip was cleaned, dried, and dressed sterilely using Dermabond and   Aquacel dressing.  The patient was then brought   to recovery room  in stable condition tolerating the procedure well.    Skip Mayer, PA-C was present for the entirety of the case involved from   preoperative positioning, perioperative retractor management, general   facilitation of the case, as well as primary wound closure as assistant.            Madlyn Frankel Charlann Boxer, M.D.        02/26/2018 10:05 AM

## 2018-02-26 NOTE — Transfer of Care (Signed)
Immediate Anesthesia Transfer of Care Note  Patient: Onelia Cadmus  Procedure(s) Performed: RIGHT TOTAL HIP ARTHROPLASTY ANTERIOR APPROACH (Right Hip)  Patient Location: PACU  Anesthesia Type:Spinal  Level of Consciousness: awake, alert  and oriented  Airway & Oxygen Therapy: Patient Spontanous Breathing  Post-op Assessment: Report given to RN and Post -op Vital signs reviewed and stable  Post vital signs: Reviewed and stable  Last Vitals:  Vitals Value Taken Time  BP 106/73 02/26/2018 10:36 AM  Temp    Pulse 70 02/26/2018 10:37 AM  Resp 15 02/26/2018 10:37 AM  SpO2 97 % 02/26/2018 10:37 AM  Vitals shown include unvalidated device data.  Last Pain:  Vitals:   02/26/18 0641  TempSrc:   PainSc: 6       Patients Stated Pain Goal: 3 (02/26/18 1610)  Complications: No apparent anesthesia complications

## 2018-02-26 NOTE — Evaluation (Signed)
Physical Therapy Evaluation Patient Details Name: Deborah Baldwin MRN: [id number] DOB: [birth year] Today's Date: 02/26/2018   History of Present Illness  R DA-THA  Clinical Impression  Pt is s/p THA resulting in the deficits listed below (see PT Problem List). Pt ambulated 34' with RW, no loss of balance. Initiated HEP, good progress expected.  Pt will benefit from skilled PT to increase their independence and safety with mobility to allow discharge to the venue listed below.      Follow Up Recommendations Follow surgeon's recommendation for DC plan and follow-up therapies    Equipment Recommendations  None recommended by PT    Recommendations for Other Services       Precautions / Restrictions Precautions Precautions: Fall Restrictions Weight Bearing Restrictions: No      Mobility  Bed Mobility Overal bed mobility: Modified Independent             General bed mobility comments: HOB up  Transfers Overall transfer level: Needs assistance Equipment used: Rolling walker (2 wheeled) Transfers: Sit to/from Stand Sit to Stand: Min guard         General transfer comment: VCs hand placement, min/guard safety  Ambulation/Gait Ambulation/Gait assistance: Min guard Gait Distance (Feet): 60 Feet Assistive device: Rolling walker (2 wheeled) Gait Pattern/deviations: Step-through pattern;Decreased weight shift to right;Decreased stride length     General Gait Details: steady, no loss of balance  Stairs            Wheelchair Mobility    Modified Rankin (Stroke Patients Only)       Balance Overall balance assessment: Modified Independent                                           Pertinent Vitals/Pain Pain Assessment: 0-10 Pain Score: 3  Pain Location: R hip Pain Descriptors / Indicators: Sore Pain Intervention(s): Limited activity within patient's tolerance;Monitored during session;Premedicated before session;Ice applied    Home  Living Family/patient expects to be discharged to:: Private residence Living Arrangements: Spouse/significant other Available Help at Discharge: Family;Available 24 hours/day   Home Access: Stairs to enter Entrance Stairs-Rails: None Entrance Stairs-Number of Steps: 2 Home Layout: One level Home Equipment: Walker - 2 wheels;Bedside commode      Prior Function Level of Independence: Independent               Hand Dominance        Extremity/Trunk Assessment   Upper Extremity Assessment Upper Extremity Assessment: Overall WFL for tasks assessed    Lower Extremity Assessment Lower Extremity Assessment: RLE deficits/detail RLE Deficits / Details: knee ext +3/5, hip 2/5 RLE Sensation: WNL       Communication   Communication: No difficulties  Cognition Arousal/Alertness: Awake/alert Behavior During Therapy: WFL for tasks assessed/performed Overall Cognitive Status: Within Functional Limits for tasks assessed                                        General Comments      Exercises Total Joint Exercises Ankle Circles/Pumps: AROM;Both;10 reps;Supine Quad Sets: AROM;Both;5 reps;Supine Heel Slides: AAROM;Right;10 reps;Supine Hip ABduction/ADduction: AAROM;Right;10 reps;Supine Long Arc Quad: AROM;5 reps;Right;Seated   Assessment/Plan    PT Assessment Patient needs continued PT services  PT Problem List Decreased strength;Decreased activity tolerance;Decreased mobility;Pain;Obesity  PT Treatment Interventions DME instruction;Gait training;Stair training;Therapeutic exercise;Therapeutic activities;Patient/family education    PT Goals (Current goals can be found in the Care Plan section)  Acute Rehab PT Goals Patient Stated Goal: yoga, swimming, snorkeling, going out on pontoon boat PT Goal Formulation: With patient/family Time For Goal Achievement: 03/12/18 Potential to Achieve Goals: Good    Frequency 7X/week   Barriers to discharge         Co-evaluation               AM-PAC PT "6 Clicks" Daily Activity  Outcome Measure Difficulty turning over in bed (including adjusting bedclothes, sheets and blankets)?: A Little Difficulty moving from lying on back to sitting on the side of the bed? : A Little Difficulty sitting down on and standing up from a chair with arms (e.g., wheelchair, bedside commode, etc,.)?: A Little Help needed moving to and from a bed to chair (including a wheelchair)?: A Little Help needed walking in hospital room?: A Little Help needed climbing 3-5 steps with a railing? : A Lot 6 Click Score: 17    End of Session Equipment Utilized During Treatment: Gait belt Activity Tolerance: Patient tolerated treatment well Patient left: in chair;with call bell/phone within reach;with family/visitor present Nurse Communication: Mobility status PT Visit Diagnosis: Difficulty in walking, not elsewhere classified (R26.2);Pain;Muscle weakness (generalized) (M62.81) Pain - Right/Left: Right Pain - part of body: Hip    Time: 3220-2542 PT Time Calculation (min) (ACUTE ONLY): 33 min   Charges:   PT Evaluation $PT Eval Low Complexity: 1 Low PT Treatments $Gait Training: 8-22 mins        Ralene Bathe Kistler PT 02/26/2018  Acute Rehabilitation Services Pager [phone] Office [phone]

## 2018-02-26 NOTE — Anesthesia Procedure Notes (Signed)
Spinal  Start time: 02/26/2018 8:33 AM End time: 02/26/2018 8:37 AM Staffing Anesthesiologist: Lucretia Kern, MD Resident/CRNA: Marny Lowenstein, CRNA Performed: resident/CRNA  Preanesthetic Checklist Completed: patient identified, site marked, surgical consent, pre-op evaluation, timeout performed, IV checked, risks and benefits discussed and monitors and equipment checked Spinal Block Patient position: sitting Prep: Betadine Patient monitoring: heart rate, continuous pulse ox and blood pressure Approach: midline Location: L3-4 Injection technique: single-shot Needle Needle type: Pencan  Needle gauge: 24 G Needle length: 10 cm Assessment Sensory level: T6

## 2018-02-26 NOTE — Interval H&P Note (Signed)
History and Physical Interval Note:  02/26/2018 7:17 AM  Deborah Baldwin  has presented today for surgery, with the diagnosis of Right hip osteoarthritis  The various methods of treatment have been discussed with the patient and family. After consideration of risks, benefits and other options for treatment, the patient has consented to  Procedure(s) with comments: RIGHT TOTAL HIP ARTHROPLASTY ANTERIOR APPROACH (Right) - 70 mins as a surgical intervention .  The patient's history has been reviewed, patient examined, no change in status, stable for surgery.  I have reviewed the patient's chart and labs.  Questions were answered to the patient's satisfaction.     [name]

## 2018-02-27 ENCOUNTER — Encounter (HOSPITAL_COMMUNITY): Payer: Self-pay | Admitting: Orthopedic Surgery

## 2018-02-27 LAB — CBC
HEMATOCRIT: 34.8 % — AB (ref 36.0–46.0)
HEMOGLOBIN: 11.2 g/dL — AB (ref 12.0–15.0)
MCH: 30.5 pg (ref 26.0–34.0)
MCHC: 32.2 g/dL (ref 30.0–36.0)
MCV: 94.8 fL (ref 80.0–100.0)
NRBC: 0 % (ref 0.0–0.2)
Platelets: 326 10*3/uL (ref 150–400)
RBC: 3.67 MIL/uL — ABNORMAL LOW (ref 3.87–5.11)
RDW: 12.9 % (ref 11.5–15.5)
WBC: 8.5 10*3/uL (ref 4.0–10.5)

## 2018-02-27 LAB — BASIC METABOLIC PANEL
ANION GAP: 7 (ref 5–15)
BUN: 8 mg/dL (ref 6–20)
CHLORIDE: 108 mmol/L (ref 98–111)
CO2: 27 mmol/L (ref 22–32)
Calcium: 8.7 mg/dL — ABNORMAL LOW (ref 8.9–10.3)
Creatinine, Ser: 0.46 mg/dL (ref 0.44–1.00)
GFR calc Af Amer: >60 mL/min (ref >60)
GLUCOSE: 137 mg/dL — AB (ref 70–99)
Potassium: 3.9 mmol/L (ref 3.5–5.1)
Sodium: 142 mmol/L (ref 135–145)

## 2018-02-27 NOTE — Progress Notes (Signed)
Physical Therapy Treatment Patient Details Name: Deborah Baldwin MRN: [id number] DOB: 1960/11/08 Today's Date: 02/27/2018    History of Present Illness R DA-THA    PT Comments    Pt is progressing well with mobility, she met PT goals and is ready to DC home from PT standpoint. She ambulated 400' with RW, performed THA HEP with good technique, and completed stair training.    Follow Up Recommendations  Follow surgeon's recommendation for DC plan and follow-up therapies     Equipment Recommendations  None recommended by PT    Recommendations for Other Services       Precautions / Restrictions Precautions Precautions: Fall Restrictions Weight Bearing Restrictions: No    Mobility  Bed Mobility               General bed mobility comments: up in recliner  Transfers Overall transfer level: Needs assistance Equipment used: Rolling walker (2 wheeled) Transfers: Sit to/from Stand Sit to Stand: Modified independent (Device/Increase time)         General transfer comment: good hand placement  Ambulation/Gait Ambulation/Gait assistance: Modified independent (Device/Increase time) Gait Distance (Feet): 400 Feet Assistive device: Rolling walker (2 wheeled) Gait Pattern/deviations: Step-through pattern;Decreased weight shift to right;Decreased stride length     General Gait Details: steady, no loss of balance   Stairs Stairs: Yes Stairs assistance: Min assist Stair Management: No rails;Backwards;Step to pattern;With walker Number of Stairs: 2 General stair comments: VCs sequencing, Min A to manage RW, husband present and assisted with management of RW   Wheelchair Mobility    Modified Rankin (Stroke Patients Only)       Balance Overall balance assessment: Modified Independent                                          Cognition Arousal/Alertness: Awake/alert Behavior During Therapy: WFL for tasks assessed/performed Overall Cognitive  Status: Within Functional Limits for tasks assessed                                        Exercises Total Joint Exercises Ankle Circles/Pumps: AROM;Both;10 reps;Supine Quad Sets: AROM;Both;Supine;10 reps Short Arc Quad: AROM;Right;10 reps;Supine Heel Slides: Right;10 reps;Supine;AROM Hip ABduction/ADduction: Right;10 reps;Supine;AROM Long Arc Quad: AROM;Right;Seated;10 reps    General Comments        Pertinent Vitals/Pain Pain Score: 3  Pain Location: R hip Pain Descriptors / Indicators: Sore Pain Intervention(s): Limited activity within patient's tolerance;Monitored during session;Premedicated before session;Ice applied    Home Living                      Prior Function            PT Goals (current goals can now be found in the care plan section) Acute Rehab PT Goals Patient Stated Goal: yoga, swimming, snorkeling, going out on pontoon boat PT Goal Formulation: All assessment and education complete, DC therapy Time For Goal Achievement: 03/12/18 Potential to Achieve Goals: Good Progress towards PT goals: Goals met/education completed, patient discharged from PT    Frequency    7X/week      PT Plan Current plan remains appropriate    Co-evaluation              AM-PAC PT "6 Clicks" Daily Activity  Outcome Measure  Difficulty  turning over in bed (including adjusting bedclothes, sheets and blankets)?: None Difficulty moving from lying on back to sitting on the side of the bed? : None Difficulty sitting down on and standing up from a chair with arms (e.g., wheelchair, bedside commode, etc,.)?: None Help needed moving to and from a bed to chair (including a wheelchair)?: None Help needed walking in hospital room?: None Help needed climbing 3-5 steps with a railing? : A Little 6 Click Score: 23    End of Session Equipment Utilized During Treatment: Gait belt Activity Tolerance: Patient tolerated treatment well Patient left: in  chair;with call bell/phone within reach;with family/visitor present Nurse Communication: Mobility status PT Visit Diagnosis: Difficulty in walking, not elsewhere classified (R26.2);Pain;Muscle weakness (generalized) (M62.81) Pain - Right/Left: Right Pain - part of body: Hip     Time: 9211-9417 PT Time Calculation (min) (ACUTE ONLY): 34 min  Charges:  $Gait Training: 8-22 mins $Therapeutic Exercise: 8-22 mins                    Blondell Reveal Kistler PT 02/27/2018  Acute Rehabilitation Services Pager [phone] Office [phone]

## 2018-02-27 NOTE — Progress Notes (Signed)
Pt and Pt's Husband were provided with d/c instructions and prescriptions. After discussing the pt's d/c instructions, prescriptions, and plan of care upon d/c home, the pt and pt's Husband reported no further questions or concerns.

## 2018-02-27 NOTE — Progress Notes (Signed)
     Subjective: 1 Day Post-Op Procedure(s) (LRB): RIGHT TOTAL HIP ARTHROPLASTY ANTERIOR APPROACH (Right)   Patient reports pain as mild, pain controlled.  No reported events throughout the night.  Some muscle soreness, but feels better than prior to surgery.  Dr. Charlann Boxer discussed the procedure and findings of the case. Ready to be discharged home if she does well with PT.    Objective:   VITALS:   Vitals:   02/27/18 0100 02/27/18 0610  BP: 133/75 133/74  Pulse: 75 64  Resp: 16 16  Temp: 97.9 F (36.6 C) 98 F (36.7 C)  SpO2: 100% 100%    Dorsiflexion/Plantar flexion intact Incision: dressing C/D/I No cellulitis present Compartment soft DNVI no issues with peroneal nerve function after surgery (valgus knee with flexion contracture)   LABS Recent Labs    02/27/18 0512  HGB 11.2*  HCT 34.8*  WBC 8.5  PLT 326    Recent Labs    02/27/18 0512  NA 142  K 3.9  BUN 8  CREATININE 0.46  GLUCOSE 137*     Assessment/Plan: 1 Day Post-Op Procedure(s) (LRB): RIGHT TOTAL HIP ARTHROPLASTY ANTERIOR APPROACH (Right) Foley d/c'ed Advance diet Up with therapy D/C IV fluids Discharge home Follow up in 2 weeks at [facility] Follow up with OLIN,Dinna Severs D in 2 weeks.  Contact information:  [facility]) [address] [place] [address] [phone]       Anastasio Auerbach. Marieanne Marxen   PAC  02/27/2018, 7:41 AM

## 2018-02-27 NOTE — Care Management (Signed)
DC plan per pt conversation and MD note: No PT, and has DME. Sandford Craze RN,BSN [phone]

## 2018-03-04 NOTE — Discharge Summary (Signed)
Physician Discharge Summary  Patient ID: Deborah Baldwin MRN: [id number] DOB/AGE: February 28, 1961 57 y.o.  Admit date: 02/26/2018 Discharge date: 02/27/2018   Procedures:  Procedure(s) (LRB): RIGHT TOTAL HIP ARTHROPLASTY ANTERIOR APPROACH (Right)  Attending Physician:  Dr. Durene Romans   Admission Diagnoses:   Right hip primary OA / pain  Discharge Diagnoses:  Principal Problem:   S/P right THA, AA Active Problems:   S/P hip replacement  Past Medical History:  Diagnosis Date  . Arthritis    , Subtolar  . Hyperlipidemia   . hypertension   . Obesity   . Paroxysmal A-fib (HCC) 2016   patient reports in 2016 hx of dizziness, was eval by cardio Dr Anne Fu , underwent full cardio w/u. during 25 holter monitor there was a 'blip' that was a brief run of afib ;  pt was put on xarelto for 18 mos but was taken off and dx with "occasional vertigo" as reason for the dizziness and that run of  afib was stress induced   . Rosacea   . Sinus bradycardia on ECG   . Vertigo    occ    HPI:    Deborah Baldwin, 57 y.o. female, has a history of pain and functional disability in the right hip(s) due to arthritis and patient has failed non-surgical conservative treatments for greater than 12 weeks to include NSAID's and/or analgesics, corticosteriod injections and activity modification.  Onset of symptoms was gradual starting 2.5 years ago with gradually worsening course since that time.The patient noted no past surgery on the right hip(s).  Patient currently rates pain in the right hip at 9 out of 10 with activity. Patient has night pain, worsening of pain with activity and weight bearing, trendelenberg gait, pain that interfers with activities of daily living and pain with passive range of motion. Patient has evidence of periarticular osteophytes and joint space narrowing by imaging studies. This condition presents safety issues increasing the risk of falls. There is no current active infection.  Risks,  benefits and expectations were discussed with the patient.  Risks including but not limited to the risk of anesthesia, blood clots, nerve damage, blood vessel damage, failure of the prosthesis, infection and up to and including death.  Patient understand the risks, benefits and expectations and wishes to proceed with surgery.   PCP: Joycelyn Rua, MD   Discharged Condition: good  Hospital Course:  Patient underwent the above stated procedure on 02/26/2018. Patient tolerated the procedure well and brought to the recovery room in good condition and subsequently to the floor.  POD #1 BP: 133/74 ; Pulse: 64 ; Temp: 98 F (36.7 C) ; Resp: 16 Patient reports pain as mild, pain controlled.  No reported events throughout the night.  Some muscle soreness, but feels better than prior to surgery.  Dr. Charlann Boxer discussed the procedure and findings of the case. Ready to be discharged home. Dorsiflexion/plantar flexion intact, incision: dressing C/D/I, no cellulitis present and compartment soft.   LABS  Basename    HGB     11.2  HCT     34.8    Discharge Exam: General appearance: alert, cooperative and no distress Extremities: Homans sign is negative, no sign of DVT, no edema, redness or tenderness in the calves or thighs and no ulcers, gangrene or trophic changes  Disposition:  Home with follow up in 2 weeks   Follow-up Information    Durene Romans, MD. Schedule an appointment as soon as possible for a visit in 2 weeks.  Specialty:  Orthopedic Surgery Contact information: [address] [place] [address] [phone]           Discharge Instructions    Call MD / Call 911   Complete by:  As directed    If you experience chest pain or shortness of breath, CALL 911 and be transported to the hospital emergency room.  If you develope a fever above 101 F, pus (white drainage) or increased drainage or redness at the wound, or calf pain, call your surgeon's office.   Change  dressing   Complete by:  As directed    Maintain surgical dressing until follow up in the clinic. If the edges start to pull up, may reinforce with tape. If the dressing is no longer working, may remove and cover with gauze and tape, but must keep the area dry and clean.  Call with any questions or concerns.   Constipation Prevention   Complete by:  As directed    Drink plenty of fluids.  Prune juice may be helpful.  You may use a stool softener, such as Colace (over the counter) 100 mg twice a day.  Use MiraLax (over the counter) for constipation as needed.   Diet - low sodium heart healthy   Complete by:  As directed    Discharge instructions   Complete by:  As directed    Maintain surgical dressing until follow up in the clinic. If the edges start to pull up, may reinforce with tape. If the dressing is no longer working, may remove and cover with gauze and tape, but must keep the area dry and clean.  Follow [facility] in 2 weeks at [facility]. Call with any questions or concerns.   Increase activity slowly as tolerated   Complete by:  As directed    Weight bearing as tolerated with assist device (walker, cane, etc) as directed, use it as long as suggested by your surgeon or therapist, typically at least 4-6 weeks.   TED hose   Complete by:  As directed    Use stockings (TED hose) for 2 weeks on both leg(s).  You may remove them at night for sleeping.      Allergies as of 02/27/2018      Reactions   Minocin [minocycline Hcl] Hives   Tetracyclines & Related Hives      Medication List    STOP taking these medications   ibuprofen 800 MG tablet Commonly known as:  ADVIL,MOTRIN     TAKE these medications   aspirin 81 MG chewable tablet Chew 1 tablet (81 mg total) by mouth 2 (two) times daily. Take for 4 weeks, then resume regular dose.   atorvastatin 20 MG tablet Commonly known as:  LIPITOR Take 20 mg by mouth daily.   docusate sodium 100 MG capsule Commonly known as:   COLACE Take 1 capsule (100 mg total) by mouth 2 (two) times daily.   famotidine 10 MG tablet Commonly known as:  PEPCID Take 10 mg by mouth 2 (two) times daily.   ferrous sulfate 325 (65 FE) MG tablet Take 1 tablet (325 mg total) by mouth 3 (three) times daily with meals.   HYDROcodone-acetaminophen 7.5-325 MG tablet Commonly known as:  NORCO Take 1-2 tablets by mouth every 4 (four) hours as needed for moderate pain.   HYDROcodone-acetaminophen 7.5-325 MG tablet Commonly known as:  NORCO Take 1-2 tablets by mouth every 4 (four) hours as needed for moderate pain.   lisinopril-hydrochlorothiazide 20-12.5 MG  tablet Commonly known as:  PRINZIDE,ZESTORETIC Take 1 tablet by mouth daily.   methocarbamol 500 MG tablet Commonly known as:  ROBAXIN Take 1 tablet (500 mg total) by mouth every 6 (six) hours as needed for muscle spasms.   polyethylene glycol packet Commonly known as:  MIRALAX / GLYCOLAX Take 17 g by mouth 2 (two) times daily.            Discharge Care Instructions  (From admission, onward)         Start     Ordered   02/27/18 0000  Change dressing    Comments:  Maintain surgical dressing until follow up in the clinic. If the edges start to pull up, may reinforce with tape. If the dressing is no longer working, may remove and cover with gauze and tape, but must keep the area dry and clean.  Call with any questions or concerns.   02/27/18 0745           Signed: Anastasio Auerbach. Luismiguel Lamere   PA-C  03/04/2018, 1:08 PM

## 2018-04-01 ENCOUNTER — Other Ambulatory Visit: Payer: Self-pay | Admitting: Family Medicine

## 2018-04-01 DIAGNOSIS — R921 Mammographic calcification found on diagnostic imaging of breast: Secondary | ICD-10-CM

## 2018-04-17 ENCOUNTER — Ambulatory Visit
Admission: RE | Admit: 2018-04-17 | Discharge: 2018-04-17 | Disposition: A | Discharge status: Home / Self Care | Payer: 59 | Source: Ambulatory Visit | Attending: Family Medicine | Admitting: Family Medicine

## 2018-04-17 DIAGNOSIS — R921 Mammographic calcification found on diagnostic imaging of breast: Secondary | ICD-10-CM

## 2019-03-24 ENCOUNTER — Other Ambulatory Visit: Payer: Self-pay | Admitting: Family Medicine

## 2019-03-24 DIAGNOSIS — Z1231 Encounter for screening mammogram for malignant neoplasm of breast: Secondary | ICD-10-CM

## 2019-05-16 ENCOUNTER — Other Ambulatory Visit: Payer: Self-pay

## 2019-05-16 ENCOUNTER — Ambulatory Visit
Admission: RE | Admit: 2019-05-16 | Discharge: 2019-05-16 | Disposition: A | Discharge status: Home / Self Care | Payer: 59 | Source: Ambulatory Visit | Attending: Family Medicine | Admitting: Family Medicine

## 2019-05-16 DIAGNOSIS — Z1231 Encounter for screening mammogram for malignant neoplasm of breast: Secondary | ICD-10-CM

## 2019-07-25 ENCOUNTER — Ambulatory Visit: Payer: 59 | Attending: Internal Medicine

## 2019-07-25 DIAGNOSIS — Z23 Encounter for immunization: Secondary | ICD-10-CM

## 2019-07-25 NOTE — Progress Notes (Signed)
   Covid-19 Vaccination Clinic  Name:  Deborah Baldwin    MRN: [id number] DOB: Apr 24, 1961  07/25/2019  [name] was observed post Covid-19 immunization for 15 minutes without incident. She was provided with Vaccine Information Sheet and instruction to access the V-Safe system.   [name] was instructed to call 911 with any severe reactions post vaccine: Marland Kitchen Difficulty breathing  . Swelling of face and throat  . A fast heartbeat  . A bad rash all over body  . Dizziness and weakness   Immunizations Administered    Name Date Dose VIS Date Route   Pfizer COVID-19 Vaccine 07/25/2019  3:08 PM 0.3 mL 04/18/2019 Intramuscular   Manufacturer: [employer], [employer]   Lot: [id number]   NDC: [address]-7

## 2019-08-15 ENCOUNTER — Ambulatory Visit: Payer: 59 | Attending: Internal Medicine

## 2019-08-15 DIAGNOSIS — Z23 Encounter for immunization: Secondary | ICD-10-CM

## 2019-08-15 NOTE — Progress Notes (Signed)
   Covid-19 Vaccination Clinic  Name:  Deborah Baldwin    MRN: [id number] DOB: 02/25/1961  08/15/2019  Deborah Baldwin was observed post Covid-19 immunization for 15 minutes without incident. She was provided with Vaccine Information Sheet and instruction to access the V-Safe system.   Deborah Baldwin was instructed to call 911 with any severe reactions post vaccine: Marland Kitchen Difficulty breathing  . Swelling of face and throat  . A fast heartbeat  . A bad rash all over body  . Dizziness and weakness   Immunizations Administered    Name Date Dose VIS Date Route   Pfizer COVID-19 Vaccine 08/15/2019  2:53 PM 0.3 mL 04/18/2019 Intramuscular   Manufacturer: [employer], [employer]   Lot: [id number]   NDC: [address]-9

## 2019-08-20 ENCOUNTER — Ambulatory Visit: Payer: 59

## 2020-04-13 ENCOUNTER — Other Ambulatory Visit: Payer: Self-pay | Admitting: Family Medicine

## 2020-04-13 ENCOUNTER — Other Ambulatory Visit: Payer: Self-pay | Admitting: Diagnostic Radiology

## 2020-04-13 DIAGNOSIS — Z1231 Encounter for screening mammogram for malignant neoplasm of breast: Secondary | ICD-10-CM

## 2020-05-26 ENCOUNTER — Ambulatory Visit
Admission: RE | Admit: 2020-05-26 | Discharge: 2020-05-26 | Disposition: A | Discharge status: Home / Self Care | Payer: 59 | Source: Ambulatory Visit | Attending: Family Medicine | Admitting: Family Medicine

## 2020-05-26 ENCOUNTER — Other Ambulatory Visit: Payer: Self-pay

## 2020-05-26 DIAGNOSIS — Z1231 Encounter for screening mammogram for malignant neoplasm of breast: Secondary | ICD-10-CM

## 2021-05-06 ENCOUNTER — Other Ambulatory Visit: Payer: Self-pay | Admitting: Family Medicine

## 2021-05-06 DIAGNOSIS — Z1231 Encounter for screening mammogram for malignant neoplasm of breast: Secondary | ICD-10-CM

## 2021-05-27 ENCOUNTER — Ambulatory Visit
Admission: RE | Admit: 2021-05-27 | Discharge: 2021-05-27 | Disposition: A | Discharge status: Home / Self Care | Payer: 59 | Source: Ambulatory Visit | Attending: Family Medicine | Admitting: Family Medicine

## 2021-05-27 DIAGNOSIS — Z1231 Encounter for screening mammogram for malignant neoplasm of breast: Secondary | ICD-10-CM

## 2022-04-14 ENCOUNTER — Other Ambulatory Visit: Payer: Self-pay | Admitting: Family Medicine

## 2022-04-14 DIAGNOSIS — Z1231 Encounter for screening mammogram for malignant neoplasm of breast: Secondary | ICD-10-CM

## 2022-06-08 ENCOUNTER — Ambulatory Visit: Payer: 59

## 2022-06-09 ENCOUNTER — Ambulatory Visit
Admission: RE | Admit: 2022-06-09 | Discharge: 2022-06-09 | Disposition: A | Discharge status: Home / Self Care | Payer: 59 | Source: Ambulatory Visit | Attending: Family Medicine | Admitting: Family Medicine

## 2022-06-09 DIAGNOSIS — Z1231 Encounter for screening mammogram for malignant neoplasm of breast: Secondary | ICD-10-CM

## 2023-02-26 ENCOUNTER — Other Ambulatory Visit: Payer: Self-pay | Admitting: Family Medicine

## 2023-02-26 DIAGNOSIS — E785 Hyperlipidemia, unspecified: Secondary | ICD-10-CM

## 2023-03-01 ENCOUNTER — Ambulatory Visit (INDEPENDENT_AMBULATORY_CARE_PROVIDER_SITE_OTHER): Payer: Self-pay

## 2023-03-01 DIAGNOSIS — E785 Hyperlipidemia, unspecified: Secondary | ICD-10-CM

## 2023-04-24 NOTE — Progress Notes (Signed)
CARDIOLOGY CONSULT NOTE       Patient ID: Deborah Baldwin MRN: [id number] DOB/AGE: 01-03-1961 62 y.o.  Referring Physician: Lenise Arena Primary Physician: Joycelyn Rua, MD Primary Cardiologist: New Reason for Consultation: Aortic Atherosclerosis    HPI:  62 y.o. referred by Dr Lenise Arena for aortic atherosclerosis History of GAD, HLD, HTN, ? Brief PAF seen by Dr Anne Fu in 2016 dizziness and vertigo not thought to be associated with arrhythmia Not on anticoagulation On statin and Prinzide.  Last LDL was 105   Calcium score done 03/25/23 reviewed aortic atherosclerosis with ascending thoracic aorta 4.1 cm Calcium score 218 , 95 th percentile calcium located in LAD/LCX   She is active swimming and doing water aerobics. She works for [employer] Husband does real estate. Son in [employer] [place] state grad Designer, fashion/clothing. 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   Discussed target LDL < 70 for high calcium score and aortic atherosclerosis Not there on high dose lipitor Discussed changing to 40 mg crestor and likely need for addition of Zetia and ultimately PSK9 if needed   ROS All other systems reviewed and negative except as noted above  Past Medical History:  Diagnosis Date   Anxiety    Arthritis    , Subtolar   BMI 40.0-44.9, adult (HCC)    Hyperlipidemia    hypertension    Left leg paresthesias    Meralgia paresthetica of left side    Obesity    Osteoarthritis of left hip    Paroxysmal A-fib (HCC) 2016   patient reports in 2016 hx of dizziness, was eval by cardio Dr Anne Fu , underwent full cardio w/u. during 25 holter monitor there was a 'blip' that was a brief run of afib ;  pt was put on xarelto for 18 mos but was taken off and dx with "occasional vertigo" as reason for the dizziness and that run of  afib was stress induced    Pedal edema    Prediabetes    Rosacea    Sinus bradycardia on ECG    Vertigo    occ    Family History  Problem Relation Age of  Onset   Prostate cancer Father    Hypertension Father    Hypertension Mother    Colon polyps Mother    Diverticulitis Mother    Heart disease Maternal Grandfather    Colon cancer Maternal Grandfather    Dementia Paternal Grandfather    Hypertension Paternal Grandmother    Breast cancer Neg Hx     Social History   Socioeconomic History   Marital status: Married    Spouse name: Not on file   Number of children: 2   Years of education: Not on file   Highest education level: Not on file  Occupational History   Not on file  Tobacco Use   Smoking status: Never   Smokeless tobacco: Never  Vaping Use   Vaping status: Never Used  Substance and Sexual Activity   Alcohol use: Yes    Alcohol/week: 6.0 standard drinks of alcohol    Types: 6 Standard drinks or equivalent per week    Comment: 1-2  daily   Drug use: Never   Sexual activity: Not on file  Other Topics Concern   Not on file  Social History Narrative   Not on file   Social Drivers of Health   Financial Resource Strain: Not on file  Food Insecurity: Unknown (05/10/2022)   Received from [facility]   Food Insecurity    In the past 3 months, have you had to go without food for 24 hours, multiple times due to lack of resources?: Not on file  Transportation Needs: Unknown (05/10/2022)   Received from [facility]   Transportation Needs    In the past 3 months, has lack of transporation kept you from medical appointments or getting things you need that are essential to your health?: Not on file  Physical Activity: Not on file  Stress: Not on file  Social Connections: Unknown (06/29/2022)   Received from [facility]   Social Connections    In the past 3 months, do you feel that you lack companionship or social support?: Not on file  Intimate Partner Violence: Not on file    Past Surgical History:  Procedure Laterality Date   BREAST BIOPSY Left 10/15/2017   COLONOSCOPY      endoablatin     TONSILLECTOMY     TOTAL HIP ARTHROPLASTY Right 02/26/2018   Procedure: RIGHT TOTAL HIP ARTHROPLASTY ANTERIOR APPROACH;  Surgeon: Durene Romans, MD;  Location: [facility];  Service: Orthopedics;  Laterality: Right;  70 mins      Current Outpatient Medications:    atorvastatin (LIPITOR) 20 MG tablet, Take 20 mg by mouth daily., Disp: , Rfl: 5   citalopram (CELEXA) 10 MG tablet, Take 10 mg by mouth daily., Disp: , Rfl:    famotidine (PEPCID) 10 MG tablet, Take 10 mg by mouth 2 (two) times daily., Disp: , Rfl:    lisinopril-hydrochlorothiazide (PRINZIDE,ZESTORETIC) 20-12.5 MG per tablet, Take 1 tablet by mouth daily., Disp: 30 tablet, Rfl: 11   penicillin v potassium (VEETID) 500 MG tablet, Take 500 mg by mouth 2 (two) times daily., Disp: , Rfl:     Physical Exam: Blood pressure 130/76, pulse 88, resp. rate 16, height 5\' 7"  (1.702 m), weight 260 lb (117.9 kg), SpO2 96%.    Affect appropriate Healthy:  appears stated age HEENT: normal Neck supple with no adenopathy JVP normal no bruits no thyromegaly Lungs clear with no wheezing and good diaphragmatic motion Heart:  S1/S2 no murmur, no rub, gallop or click PMI normal Abdomen: benighn, BS positve, no tenderness, no AAA no bruit.  No HSM or HJR Distal pulses intact with no bruits No edema Neuro non-focal Skin warm and dry No muscular weakness   Labs:   Lab Results  Component Value Date   WBC 8.5 02/27/2018   HGB 11.2 (L) 02/27/2018   HCT 34.8 (L) 02/27/2018   MCV 94.8 02/27/2018   PLT 326 02/27/2018   No results for input(s): "NA", "K", "CL", "CO2", "BUN", "CREATININE", "CALCIUM", "PROT", "BILITOT", "ALKPHOS", "ALT", "AST", "GLUCOSE" in the last 168 hours.  Invalid input(s): "LABALBU" No results found for: "CKTOTAL", "CKMB", "CKMBINDEX", "TROPONINI" No results found for: "CHOL" No results found for: "HDL" No results found for: "LDLCALC" No results found for: "TRIG" No results found for: "CHOLHDL" No results  found for: "LDLDIRECT"    Radiology: No results found.  EKG: SR nonspecific ST changes    ASSESSMENT AND PLAN:   HLD:  with high calcium score for age Need updated lipids with target LDL < 70 LDL 018 on high dose lipitor Change to crestro 40 mg f/u lipid clinic  HTN:  continue Prinzide check BMET Dilated Aorta:  4.1 Rx BP TTE in 2015 tri leaflet AV EF normal aorta was 3.9 then  Consider update TTE in a year   Lipid/Liver 3 months Crestor  40 mg   F/U in a year   Signed: Charlton Haws 04/30/2023, 4:12 PM

## 2023-04-30 ENCOUNTER — Ambulatory Visit: Payer: 59 | Attending: Cardiovascular Disease | Admitting: Cardiovascular Disease

## 2023-04-30 ENCOUNTER — Encounter: Payer: Self-pay | Admitting: Cardiovascular Disease

## 2023-04-30 VITALS — BP 130/76 | HR 88 | Resp 16 | Ht 67.0 in | Wt 260.0 lb

## 2023-04-30 DIAGNOSIS — I77810 Thoracic aortic ectasia: Secondary | ICD-10-CM

## 2023-04-30 DIAGNOSIS — E785 Hyperlipidemia, unspecified: Secondary | ICD-10-CM | POA: Diagnosis not present

## 2023-04-30 DIAGNOSIS — J359 Chronic disease of tonsils and adenoids, unspecified: Secondary | ICD-10-CM

## 2023-04-30 DIAGNOSIS — I480 Paroxysmal atrial fibrillation: Secondary | ICD-10-CM | POA: Diagnosis not present

## 2023-04-30 MED ORDER — ROSUVASTATIN CALCIUM 40 MG PO TABS: 40.0000 mg | ORAL_TABLET | Freq: Every day | ORAL | 3 refills | Status: DC

## 2023-04-30 NOTE — Patient Instructions (Addendum)
Medication Instructions:  Your physician has recommended you make the following change in your medication:  1-STOP lipitor 2-START Crestor 40 mg by mouth daily.  *If you need a refill on your cardiac medications before your next appointment, please call your pharmacy*  Lab Work: Your physician recommends that you have fasting lipid and liver panel in 3 months at any [facility]. If you have labs (blood work) drawn today and your tests are completely normal, you will receive your results only by: MyChart Message (if you have MyChart) OR A paper copy in the mail If you have any lab test that is abnormal or we need to change your treatment, we will call you to review the results.  Follow-Up: At [facility], you and your health needs are our priority.  As part of our continuing mission to provide you with exceptional heart care, we have created designated Provider Care Teams.  These Care Teams include your primary Cardiologist (physician) and Advanced Practice Providers (APPs -  Physician Assistants and Nurse Practitioners) who all work together to provide you with the care you need, when you need it.  We recommend signing up for the patient portal called "MyChart".  Sign up information is provided on this After Visit Summary.  MyChart is used to connect with patients for Virtual Visits (Telemedicine).  Patients are able to view lab/test results, encounter notes, upcoming appointments, etc.  Non-urgent messages can be sent to your provider as well.   To learn more about what you can do with MyChart, go to [email or web address].    Your next appointment:   6 months  Provider:   Charlton Haws, MD     You have been referred to Lipid Clinic, next available appointment.

## 2023-05-11 ENCOUNTER — Other Ambulatory Visit: Payer: Self-pay | Admitting: Family Medicine

## 2023-05-11 ENCOUNTER — Ambulatory Visit: Payer: 59

## 2023-05-11 DIAGNOSIS — R059 Cough, unspecified: Secondary | ICD-10-CM

## 2023-05-16 ENCOUNTER — Other Ambulatory Visit: Payer: Self-pay | Admitting: Family Medicine

## 2023-05-16 DIAGNOSIS — Z1231 Encounter for screening mammogram for malignant neoplasm of breast: Secondary | ICD-10-CM

## 2023-06-11 ENCOUNTER — Ambulatory Visit
Admission: RE | Admit: 2023-06-11 | Discharge: 2023-06-11 | Disposition: A | Discharge status: Home / Self Care | Payer: 59 | Source: Ambulatory Visit | Attending: Family Medicine | Admitting: Family Medicine

## 2023-06-11 DIAGNOSIS — Z1231 Encounter for screening mammogram for malignant neoplasm of breast: Secondary | ICD-10-CM

## 2023-06-13 ENCOUNTER — Other Ambulatory Visit (HOSPITAL_COMMUNITY): Payer: Self-pay

## 2023-06-13 ENCOUNTER — Encounter: Payer: Self-pay | Admitting: Student

## 2023-06-13 ENCOUNTER — Telehealth: Payer: Self-pay | Admitting: Pharmacist

## 2023-06-13 ENCOUNTER — Ambulatory Visit: Payer: 59 | Attending: Internal Medicine | Admitting: Student

## 2023-06-13 ENCOUNTER — Telehealth: Payer: Self-pay

## 2023-06-13 DIAGNOSIS — E785 Hyperlipidemia, unspecified: Secondary | ICD-10-CM

## 2023-06-13 NOTE — Patient Instructions (Signed)
 Your Results:             Your most recent labs Goal  Total Cholesterol 230 < 200  Triglycerides 110 < 150  HDL (happy/good cholesterol) 80 > 40  LDL (lousy/bad cholesterol 120 < 70   Medication changes: We will start the process to get PCSK9i (Repatha  or Praluent)  covered by your insurance.  Once the prior authorization is complete, we will call you to let you know and confirm pharmacy information.   Praluent is a cholesterol medication that improved your body's ability to get rid of bad cholesterol known as LDL. It can lower your LDL up to 60%. It is an injection that is given under the skin every 2 weeks. The most common side effects of Praluent include runny nose, symptoms of the common cold, rarely flu or flu-like symptoms, back/muscle pain in about 3-4% of the patients, and redness, pain, or bruising at the injection site.    Repatha  is a cholesterol medication that improved your body's ability to get rid of bad cholesterol known as LDL. It can lower your LDL up to 60%! It is an injection that is given under the skin every 2 weeks. The medication often requires a prior authorization from your insurance company. The most common side effects of Repatha  include runny nose, symptoms of the common cold, rarely flu or flu-like symptoms, back/muscle pain in about 3-4% of the patients, and redness, pain, or bruising at the injection site.   Lab orders: We want to repeat labs after 2-3 months.  We will send you a lab order to remind you once we get closer to that time.

## 2023-06-13 NOTE — Assessment & Plan Note (Addendum)
 Assessment:  LDL goal: <70 mg/dl last LDLc 879 mg/dl (90/75) while on atorvastatin  20 mg  Tolerates high intensity statins well without any side effects  Atorvastatin  was switched to rosuvastatin  40 mg Dec 2024 follow up lab is due  Discussed next potential options (ezetimibe,PCSK-9 inhibitors); cost, dosing efficacy, side effects  Reiterated importance of healthy lifestyle - heart healthy diet and regular exercise  By switching moderate intensity statin to high intensity statin we are anticipating LDLc drop around 6% addition of PCSK9i to max tolerated statin would be best next option due to risk factors  Patient is in agreement to go on injectable therapies  Will get baseline Lp(a)  Plan: Continue taking current medications (Crestor  40 mg daily) Will apply for PA for PCSK9i; will inform patient upon approval  Lipid lab and LFT due today and lipid lab due  in 2-3 months after starting PCSK9i

## 2023-06-13 NOTE — Telephone Encounter (Signed)
 PCSK9i PA requested

## 2023-06-13 NOTE — Telephone Encounter (Signed)
 PA request has been Submitted. New Encounter created for follow up. For additional info see Pharmacy Prior Auth telephone encounter from 06/13/23.

## 2023-06-13 NOTE — Telephone Encounter (Signed)
 Pharmacy Patient Advocate Encounter   Received notification from Physician's Office that prior authorization for REPATHA  is required/requested.   Insurance verification completed.   The patient is insured through [facility] .   Per test claim: PA required; PA submitted to above mentioned insurance via CoverMyMeds Key/confirmation #/EOC [id number] Status is pending

## 2023-06-13 NOTE — Telephone Encounter (Signed)
 Pharmacy Patient Advocate Encounter  Received notification from [employer] that Prior Authorization for REPATHA  has been APPROVED from 06/13/23 to 06/12/24. Ran test claim, Copay is $24.99. This test claim was processed through [facility]- copay amounts may vary at other pharmacies due to pharmacy/plan contracts, or as the patient moves through the different stages of their insurance plan.

## 2023-06-13 NOTE — Progress Notes (Signed)
 Patient ID: Deborah Baldwin                 DOB: Feb 03, 1961                    MRN: [id number]      HPI: Deborah Baldwin is a 62 y.o. female patient referred to lipid clinic by Dr.Nishan. PMH is significant for PAF,hypertension,HLD,obesity. Calcium  score 03/25/23 -aortic atherosclerosis with ascending thoracic aorta 4.1 cm Calcium  score 218 , 95 th percentile,calcium  located in LAD/LCX  Crestor  40 mg was started in Dec 2024. And patient was referred to lipid clinic.   Patient presented today in good spirit. Reports she tolerates Crestor  40 mg well. Before Dec she was on atorvastatin  20 mg but after receiving Coronary CT result she was referred to cardiologist. Dr. Nishan switched atorvastatin  20 mg to Crestor  40 mg for better response. Follow up lab is due now. She eats healthy 3-4 meals per day . Exercise schedule needs improvement per patient, only walks 20-30 min 2 times per week.  She got laid off from her work however her insurance will be in the place till May 2025.  Reviewed options for lowering LDL cholesterol, including ezetimibe, PCSK-9 inhibitors.  Discussed mechanisms of action, dosing, side effects and potential decreases in LDL cholesterol.  Also reviewed cost information and potential options for patient assistance.  Current Medications: Crestor  40 mg daily  Intolerances: atorvastatin  20 mg daily  Risk Factors: CAD, CAC score 218, hypertension  LDL goal: <70 mg/dl  Last lab Sept 7975- TG 110 HDL 80, TC 230, direct LDLc 120   Diet: 3-4 meals per day  Protein for breakfast- eggs and vegetables  Lunch- left over from dinner or sandwiches  Dinner - vegetables with protein and small amount of carb 1 cup coffee in the morning     Exercise: walk around the block 2 times per week   Family History:  Relation Problem Comments  Mother Metallurgist) Colon polyps   Diverticulitis   Hypertension     Father Metallurgist) Hypertension   Prostate cancer     Brother (Alive)   Maternal  Grandmother (Deceased)   Maternal Grandfather (Deceased) Colon cancer   Heart disease     Paternal Grandmother (Deceased) Hypertension     Paternal Actor (Deceased) Dementia     Daughter Metallurgist)   Son Metallurgist)      Social History:  Alcohol: 3 glasses of wine - discuss to cut down to 7 std drink per week  Smoking: none   Labs: Lipid Panel  No results found for: CHOL, TRIG, HDL, CHOLHDL, VLDL, LDLCALC, LDLDIRECT, LABVLDL  Past Medical History:  Diagnosis Date   Anxiety    Arthritis    , Subtolar   BMI 40.0-44.9, adult (HCC)    Hyperlipidemia    hypertension    Left leg paresthesias    Meralgia paresthetica of left side    Obesity    Osteoarthritis of left hip    Paroxysmal A-fib (HCC) 2016   patient reports in 2016 hx of dizziness, was eval by cardio Dr Jeffrie , underwent full cardio w/u. during 25 holter monitor there was a 'blip' that was a brief run of afib ;  pt was put on xarelto  for 18 mos but was taken off and dx with occasional vertigo as reason for the dizziness and that run of  afib was stress induced    Pedal edema    Prediabetes    Rosacea  Sinus bradycardia on ECG    Vertigo    occ    Current Outpatient Medications on File Prior to Visit  Medication Sig Dispense Refill   citalopram (CELEXA) 10 MG tablet Take 10 mg by mouth daily.     famotidine  (PEPCID ) 10 MG tablet Take 10 mg by mouth 2 (two) times daily.     lisinopril -hydrochlorothiazide  (PRINZIDE ,ZESTORETIC ) 20-12.5 MG per tablet Take 1 tablet by mouth daily. 30 tablet 11   penicillin v potassium (VEETID) 500 MG tablet Take 500 mg by mouth 2 (two) times daily.     rosuvastatin  (CRESTOR ) 40 MG tablet Take 1 tablet (40 mg total) by mouth daily. 90 tablet 3   No current facility-administered medications on file prior to visit.    Allergies  Allergen Reactions   Minocin [Minocycline Hcl] Hives   Tetracyclines & Related Hives    Assessment/Plan:  1. Hyperlipidemia -   Problem  Hyperlipidemia   Current Medications: Crestor  40 mg daily  Intolerances: atorvastatin  20 mg daily  Risk Factors: CAD, CAC score 218, hypertension  LDL goal: <70 mg/dl  Last lab Sept 7975- TG 110 HDL 80, TC 230, direct LDLc 120 while on Lipitor 20 mg daily     Hyperlipidemia Assessment:  LDL goal: <70 mg/dl last LDLc 879 mg/dl (90/75) while on atorvastatin  20 mg  Tolerates high intensity statins well without any side effects  Atorvastatin  was switched to rosuvastatin  40 mg Dec 2024 follow up lab is due  Discussed next potential options (ezetimibe,PCSK-9 inhibitors); cost, dosing efficacy, side effects  Reiterated importance of healthy lifestyle - heart healthy diet and regular exercise  By switching moderate intensity statin to high intensity statin we are anticipating LDLc drop around 6% addition of PCSK9i to max tolerated statin would be best next option due to risk factors  Patient is in agreement to go on injectable therapies  Will get baseline Lp(a)  Plan: Continue taking current medications (Crestor  40 mg daily) Will apply for PA for PCSK9i; will inform patient upon approval  Lipid lab and LFT due today and lipid lab due  in 2-3 months after starting PCSK9i   Thank you,  Robbi Blanch, Pharm.D [facility] of [facility] [address] [place], [place] [address]  Phone: [phone]; Fax: [phone]

## 2023-06-14 LAB — HEPATIC FUNCTION PANEL
ALT: 26 [IU]/L (ref 0–32)
AST: 20 [IU]/L (ref 0–40)
Albumin: 4.4 g/dL (ref 3.8–4.9)
Alkaline Phosphatase: 94 [IU]/L (ref 44–121)
Bilirubin Total: 0.4 mg/dL (ref 0.0–1.2)
Bilirubin, Direct: 0.18 mg/dL (ref 0.00–0.40)
Total Protein: 7.4 g/dL (ref 6.0–8.5)

## 2023-06-14 LAB — LIPID PANEL
Chol/HDL Ratio: 2.4 {ratio} (ref 0.0–4.4)
Cholesterol, Total: 211 mg/dL — ABNORMAL HIGH (ref 100–199)
HDL: 88 mg/dL (ref >39)
LDL Chol Calc (NIH): 104 mg/dL — ABNORMAL HIGH (ref 0–99)
Triglycerides: 113 mg/dL (ref 0–149)
VLDL Cholesterol Cal: 19 mg/dL (ref 5–40)

## 2023-06-14 LAB — LIPOPROTEIN A (LPA): Lipoprotein (a): 208 nmol/L — ABNORMAL HIGH (ref <75.0)

## 2023-06-14 MED ORDER — REPATHA SURECLICK 140 MG/ML ~~LOC~~ SOAJ: 140.0000 mg | SUBCUTANEOUS | 3 refills | Status: DC

## 2023-06-14 NOTE — Addendum Note (Signed)
 Addended by: Nusaybah Ivie K on: 06/14/2023 03:34 PM   Modules accepted: Orders

## 2023-06-14 NOTE — Addendum Note (Signed)
 Addended by: Eryn Krejci K on: 06/14/2023 09:28 AM   Modules accepted: Orders

## 2023-06-14 NOTE — Telephone Encounter (Addendum)
 Patient made aware, f/u lab due April 14,2025. Per Dr.Nishan due to high Lpa and CAC score he wants patient to start ASA 81 mg daily. Pt has been informed

## 2023-06-19 IMAGING — MG MM DIGITAL SCREENING BILAT W/ TOMO AND CAD
6 of 10 series · 6 of 30 positions shown · non-contrast
Comparison: Previous exam(s).

CLINICAL DATA: Screening.

EXAM:
DIGITAL SCREENING BILATERAL MAMMOGRAM WITH TOMOSYNTHESIS AND CAD
TECHNIQUE: Bilateral screening digital craniocaudal and mediolateral oblique
mammograms were obtained. Bilateral screening digital breast
tomosynthesis was performed. The images were evaluated with
computer-aided detection.

[R CC synth-2D]
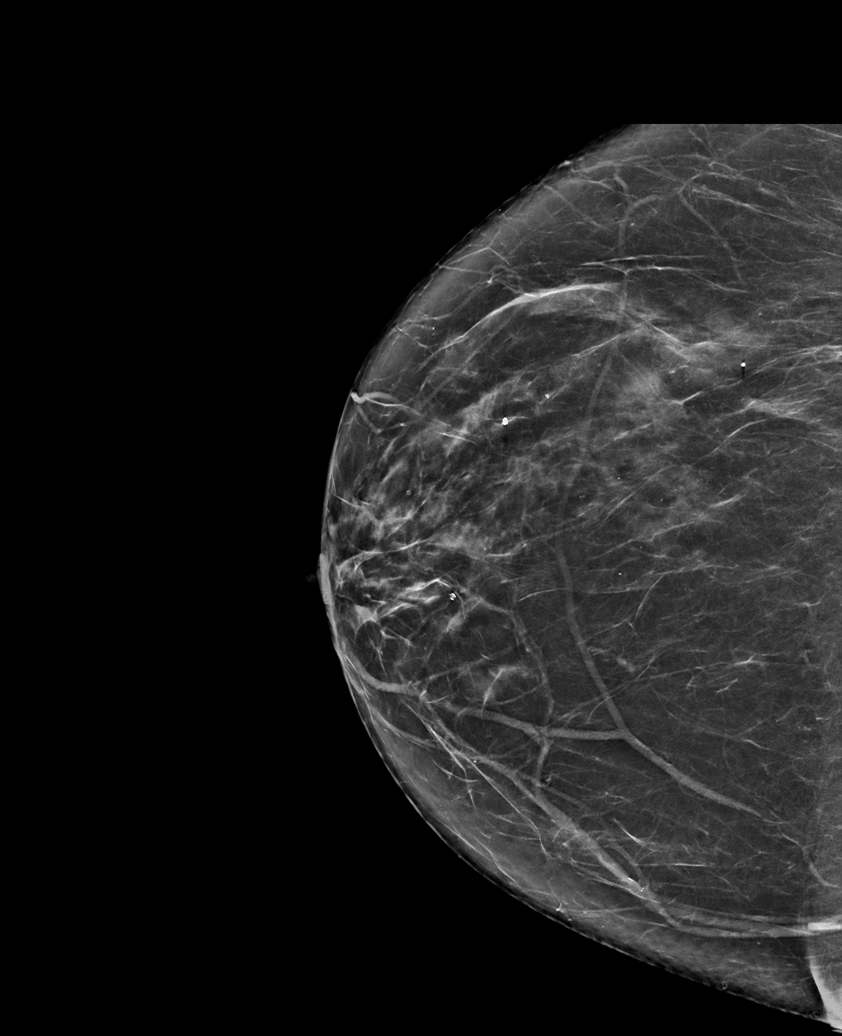

[L XCCL synth-2D]
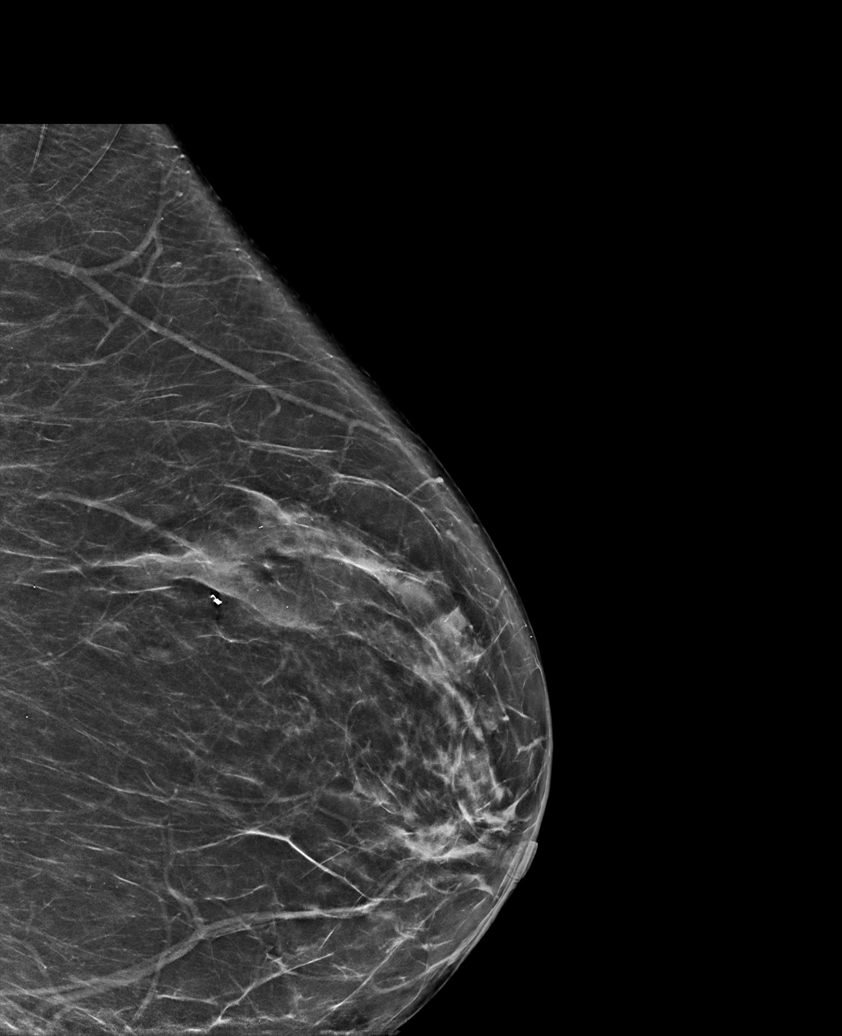

[L CC synth-2D]
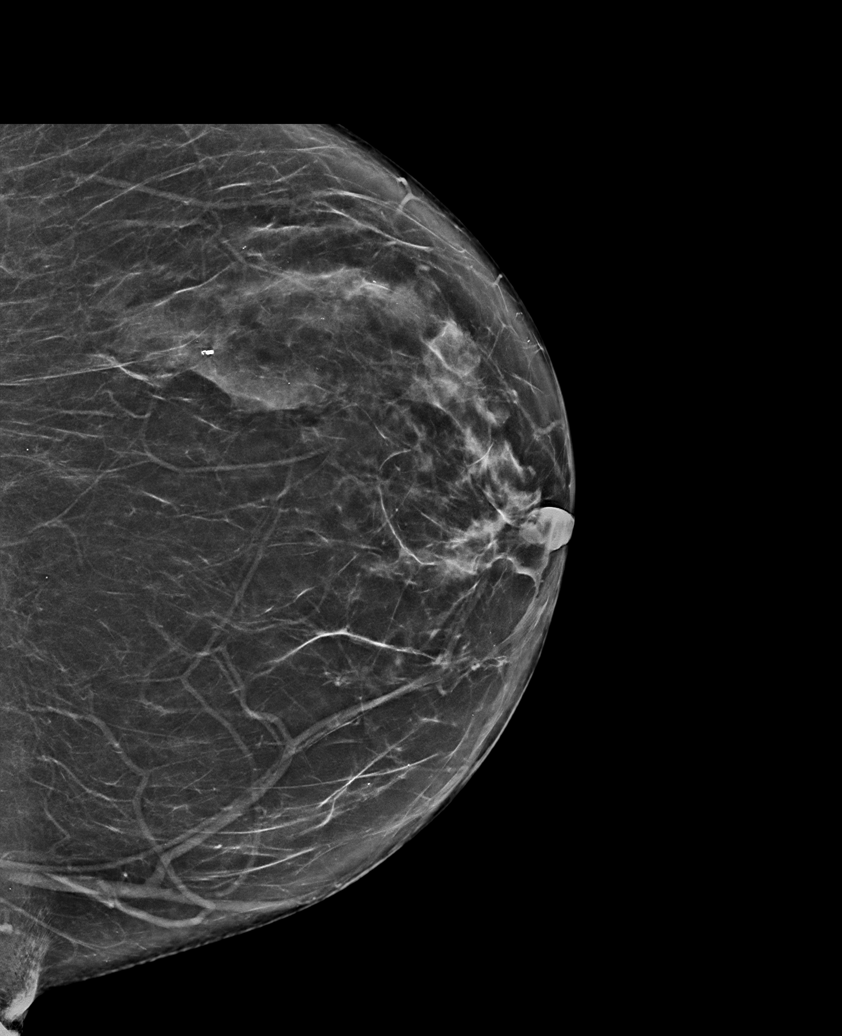

[R MLO synth-2D]
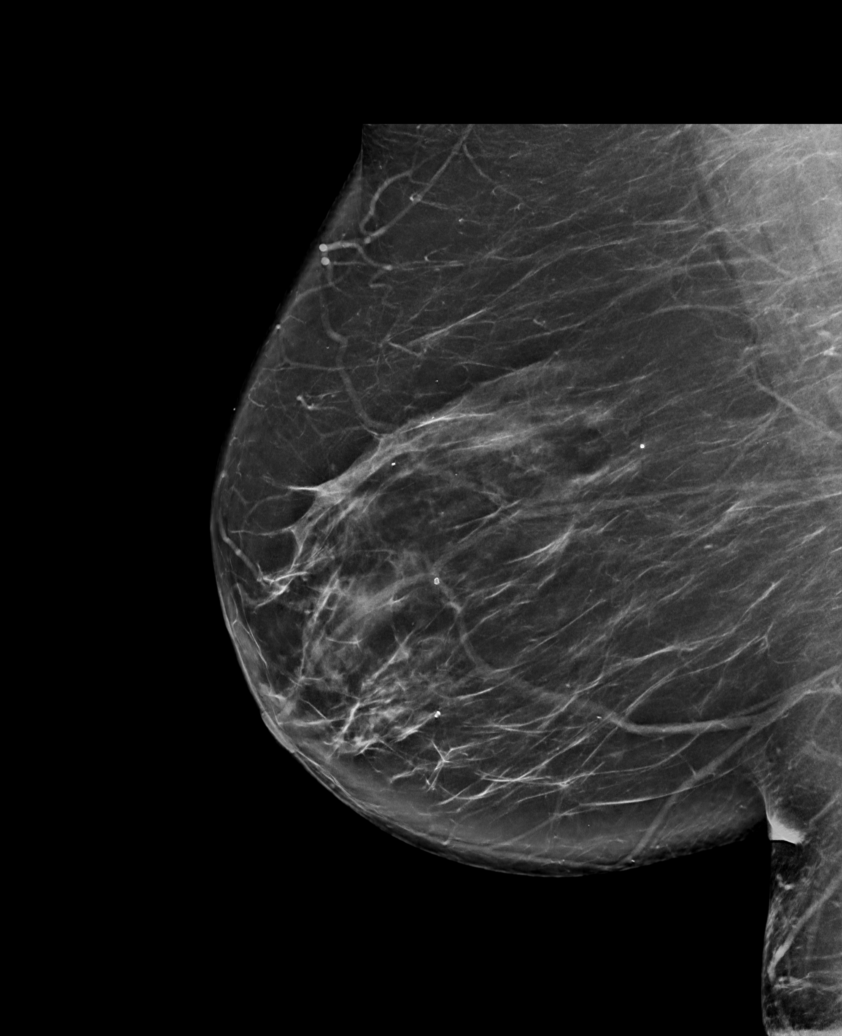

[L MLO synth-2D]
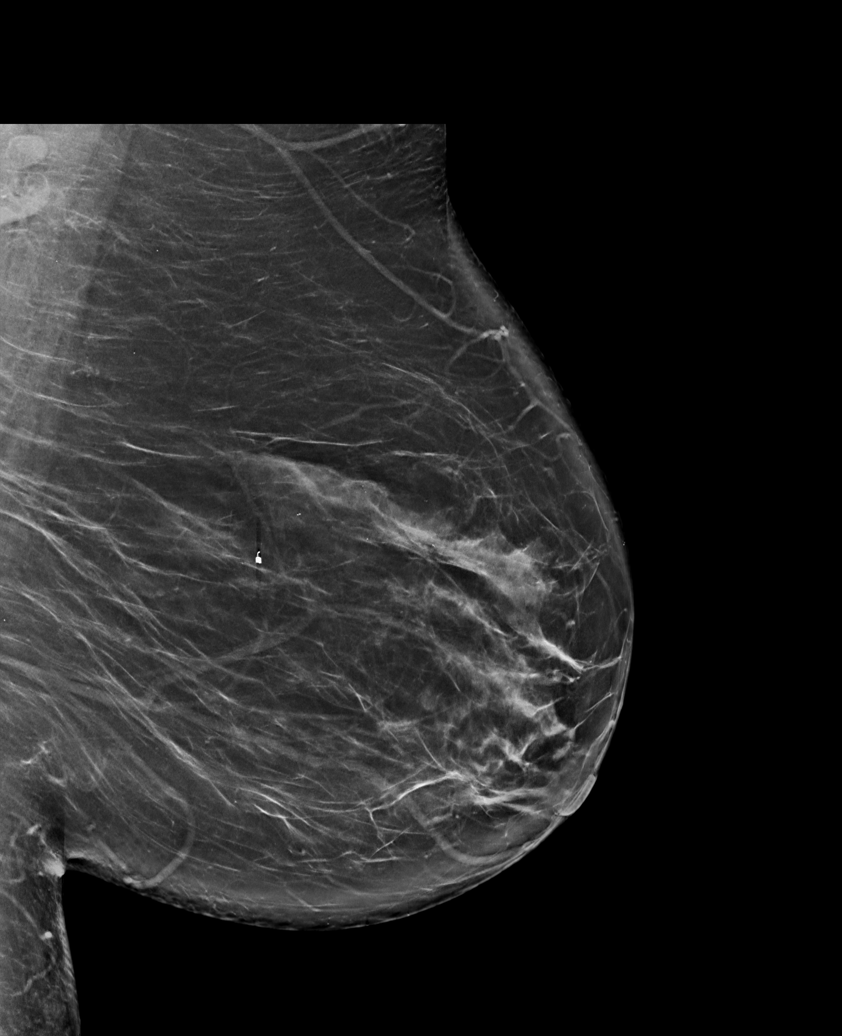

[R CC tomo · tomo slice 39/76.0]
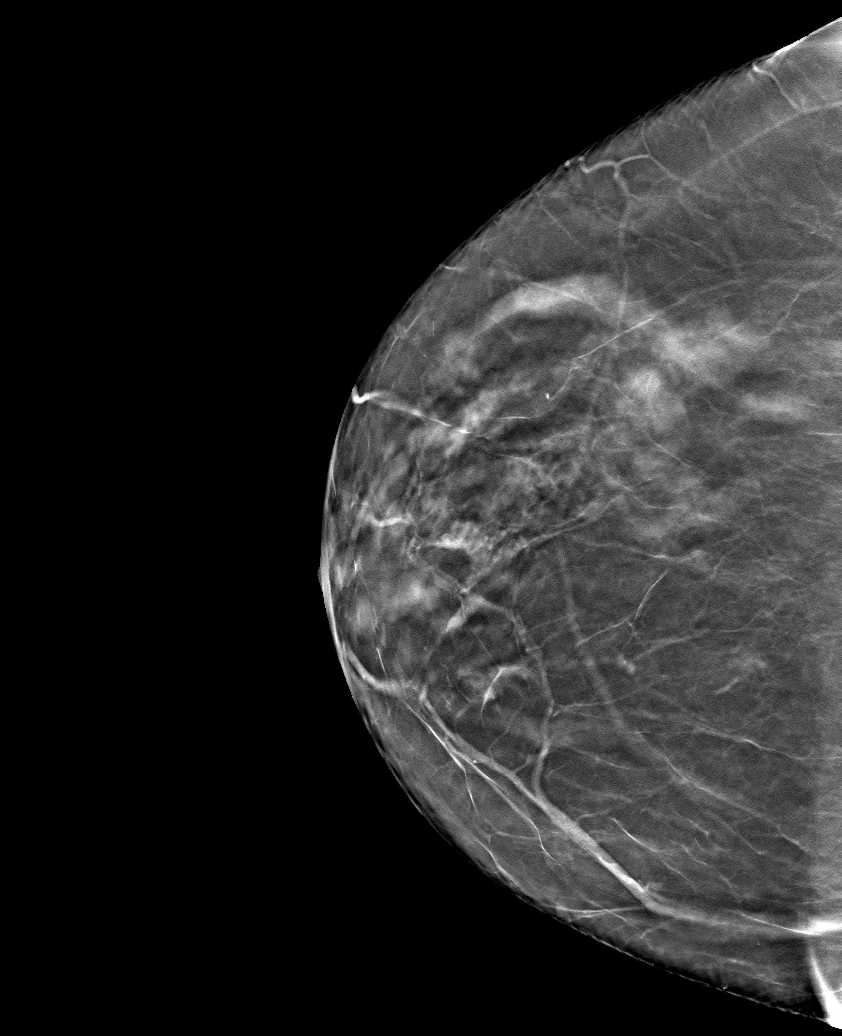

[6 of 30 positions shown; findings below may reference images not displayed]

ACR Breast Density Category b: There are scattered areas of
fibroglandular density.
FINDINGS: There are no findings suspicious for malignancy.
IMPRESSION: No mammographic evidence of malignancy. A result letter of this
screening mammogram will be mailed directly to the patient.

RECOMMENDATION:
Screening mammogram in one year. (Code:51-O-LD2)

BI-RADS CATEGORY  1: Negative.

## 2023-08-02 ENCOUNTER — Encounter: Payer: Self-pay | Admitting: Cardiovascular Disease

## 2023-08-02 DIAGNOSIS — E785 Hyperlipidemia, unspecified: Secondary | ICD-10-CM

## 2023-08-29 LAB — LIPID PANEL
Chol/HDL Ratio: 1.9 ratio (ref 0.0–4.4)
Cholesterol, Total: 147 mg/dL (ref 100–199)
HDL: 78 mg/dL (ref >39)
LDL Chol Calc (NIH): 34 mg/dL (ref 0–99)
Triglycerides: 230 mg/dL — ABNORMAL HIGH (ref 0–149)
VLDL Cholesterol Cal: 35 mg/dL (ref 5–40)

## 2023-08-29 LAB — LIPOPROTEIN A (LPA): Lipoprotein (a): 214.8 nmol/L — ABNORMAL HIGH (ref <75.0)

## 2023-08-30 ENCOUNTER — Other Ambulatory Visit: Payer: Self-pay | Admitting: Cardiovascular Disease

## 2023-10-01 NOTE — Progress Notes (Signed)
 CARDIOLOGY CONSULT NOTE       Patient ID: Deborah Baldwin MRN: [id number] DOB/AGE: 1960-09-18 63 y.o.  Referring Physician: Enrique Baldwin Primary Physician: Deborah Heater, MD Primary Cardiologist: Deborah Baldwin Reason for Consultation: Aortic Atherosclerosis    HPI:  63 y.o. referred by Dr Deborah Baldwin for aortic atherosclerosis First seen by me 04/30/23 History of GAD, HLD, HTN, ? Brief PAF seen by Dr Deborah Baldwin in 2016 dizziness and vertigo not thought to be associated with arrhythmia Not on anticoagulation On statin and Prinzide .  Last LDL was 105   Calcium  score done 03/25/23 reviewed aortic atherosclerosis with ascending thoracic aorta 4.1 cm Calcium  score 218 , 95 th percentile calcium  located in LAD/LCX   She is active swimming and doing water  aerobics. She works for [employer] Was laid off for a short time in April Husband does real estate now retired . Son in [place] grad Designer, fashion/clothing. 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    ROS All other systems reviewed and negative except as noted above  Past Medical History:  Diagnosis Date   Anxiety    Arthritis    , Subtolar   BMI 40.0-44.9, adult (HCC)    Hyperlipidemia    hypertension    Left leg paresthesias    Meralgia paresthetica of left side    Obesity    Osteoarthritis of left hip    Paroxysmal A-fib (HCC) 2016   patient reports in 2016 hx of dizziness, was eval by cardio Dr Deborah Baldwin , underwent full cardio w/u. during 25 holter monitor there was a 'blip' that was a brief run of afib ;  pt was put on xarelto  for 18 mos but was taken off and dx with "occasional vertigo" as reason for the dizziness and that run of  afib was stress induced    Pedal  edema    Prediabetes    Rosacea    Sinus bradycardia on ECG    Vertigo    occ    Family History  Problem Relation Age of Onset   Prostate cancer Father    Hypertension Father    Hypertension Mother    Colon polyps Mother    Diverticulitis Mother    Heart disease Maternal Grandfather    Colon cancer Maternal Grandfather    Dementia Paternal Grandfather    Hypertension Paternal Grandmother    Breast cancer Neg Hx     Social History   Socioeconomic History   Marital status: Married    Spouse name: Not on file   Number of children: 2   Years of education: Not on file   Highest education level: Not on file  Occupational History   Not on file  Tobacco Use   Smoking status: Never   Smokeless tobacco: Never  Vaping Use   Vaping status: Never Used  Substance and Sexual Activity   Alcohol use: Yes    Alcohol/week: 6.0 standard drinks of alcohol    Types: 6 Standard drinks or equivalent per week    Comment: 1-2  daily   Drug use: Never   Sexual activity: Not on file  Other Topics Concern   Not on file  Social History Narrative   Not  on file   Social Drivers of Health   Financial Resource Strain: Not on file  Food Insecurity: Unknown (05/10/2022)   Received from [facility]   Food Insecurity    In the past 3 months, have you had to go without food for 24 hours, multiple times due to lack of resources?: Not on file  Transportation Needs: Unknown (05/10/2022)   Received from [facility]   Transportation Needs    In the past 3 months, has lack of transporation kept you from medical appointments or getting things you need that are essential to your health?: Not on file  Physical Activity: Not on file  Stress: Not on file  Social Connections: Unknown (06/29/2022)   Received from [facility]   Social Connections    In the past 3 months, do you feel that you lack companionship or social support?: Not on file  Intimate Partner Violence:  Not on file    Past Surgical History:  Procedure Laterality Date   BREAST BIOPSY Left 10/15/2017   COLONOSCOPY     endoablatin     TONSILLECTOMY     TOTAL HIP ARTHROPLASTY Right 02/26/2018   Procedure: RIGHT TOTAL HIP ARTHROPLASTY ANTERIOR APPROACH;  Surgeon: Deborah Crew, MD;  Location: [facility];  Service: Orthopedics;  Laterality: Right;  70 mins      Current Outpatient Medications:    citalopram (CELEXA) 10 MG tablet, Take 10 mg by mouth daily., Disp: , Rfl:    Evolocumab  (REPATHA  SURECLICK) 140 MG/ML SOAJ, INJECT 140 MG INTO THE SKIN EVERY 14 DAYS, Disp: 2 mL, Rfl: 11   ibuprofen (ADVIL) 800 MG tablet, TAKE ONE TABLET BY MOUTH EVERY 8 HOURS AS NEEDED WITH FOOD OR MILK, Disp: , Rfl:    lisinopril -hydrochlorothiazide  (PRINZIDE ,ZESTORETIC ) 20-12.5 MG per tablet, Take 1 tablet by mouth daily., Disp: 30 tablet, Rfl: 11   rosuvastatin  (CRESTOR ) 40 MG tablet, Take 1 tablet (40 mg total) by mouth daily., Disp: 90 tablet, Rfl: 3    Physical Exam: Blood pressure (!) 126/90, pulse 84, height 5\' 7"  (1.702 m), weight 259 lb (117.5 kg), SpO2 97%.    Affect appropriate Healthy:  appears stated age HEENT: normal Neck supple with no adenopathy JVP normal no bruits no thyromegaly Lungs clear with no wheezing and good diaphragmatic motion Heart:  S1/S2 no murmur, no rub, gallop or click PMI normal Abdomen: benighn, BS positve, no tenderness, no AAA no bruit.  No HSM or HJR Distal pulses intact with no bruits No edema Neuro non-focal Skin warm and dry No muscular weakness   Labs:   Lab Results  Component Value Date   WBC 8.5 02/27/2018   HGB 11.2 (L) 02/27/2018   HCT 34.8 (L) 02/27/2018   MCV 94.8 02/27/2018   PLT 326 02/27/2018   No results for input(s): "NA", "K", "CL", "CO2", "BUN", "CREATININE", "CALCIUM ", "PROT", "BILITOT", "ALKPHOS", "ALT", "AST", "GLUCOSE" in the last 168 hours.  Invalid input(s): "LABALBU" No results found for: "CKTOTAL", "CKMB", "CKMBINDEX", "TROPONINI"   Lab Results  Component Value Date   CHOL 147 08/28/2023   CHOL 211 (H) 06/13/2023   Lab Results  Component Value Date   HDL 78 08/28/2023   HDL 88 06/13/2023   Lab Results  Component Value Date   LDLCALC 34 08/28/2023   LDLCALC 104 (H) 06/13/2023   Lab Results  Component Value Date   TRIG 230 (H) 08/28/2023   TRIG 113 06/13/2023   Lab Results  Component Value Date  CHOLHDL 1.9 08/28/2023   CHOLHDL 2.4 06/13/2023   No results found for: "LDLDIRECT"    Radiology: No results found.  EKG: SR nonspecific ST changes    ASSESSMENT AND PLAN:   HLD:  with high calcium  score for age and elevated LpA. On crestor  40 mg and repatha  started 08/30/23. 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

## 2023-10-15 ENCOUNTER — Ambulatory Visit: Payer: 59 | Attending: Cardiovascular Disease | Admitting: Cardiovascular Disease

## 2023-10-15 VITALS — BP 126/90 | HR 84 | Ht 67.0 in | Wt 259.0 lb

## 2023-10-15 DIAGNOSIS — E785 Hyperlipidemia, unspecified: Secondary | ICD-10-CM | POA: Diagnosis not present

## 2023-10-15 DIAGNOSIS — J359 Chronic disease of tonsils and adenoids, unspecified: Secondary | ICD-10-CM | POA: Diagnosis not present

## 2023-10-15 DIAGNOSIS — I77810 Thoracic aortic ectasia: Secondary | ICD-10-CM

## 2023-10-15 NOTE — Patient Instructions (Addendum)
 Medication Instructions:  Your physician recommends that you continue on your current medications as directed. Please refer to the Current Medication list given to you today.  *If you need a refill on your cardiac medications before your next appointment, please call your pharmacy*  Lab Work: If you have labs (blood work) drawn today and your tests are completely normal, you will receive your results only by: MyChart Message (if you have MyChart) OR A paper copy in the mail If you have any lab test that is abnormal or we need to change your treatment, we will call you to review the results.  Testing/Procedures: None ordered today.  Follow-Up: At [facility], you and your health needs are our priority.  As part of our continuing mission to provide you with exceptional heart care, our providers are all part of one team.  This team includes your primary Cardiologist (physician) and Advanced Practice Providers or APPs (Physician Assistants and Nurse Practitioners) who all work together to provide you with the care you need, when you need it.  Your next appointment:   1 year(s)  Provider:   Janelle Mediate, MD    We recommend signing up for the patient portal called "MyChart".  Sign up information is provided on this After Visit Summary.  MyChart is used to connect with patients for Virtual Visits (Telemedicine).  Patients are able to view lab/test results, encounter notes, upcoming appointments, etc.  Non-urgent messages can be sent to your provider as well.   To learn more about what you can do with MyChart, go to [email or web address].   Other Instructions

## 2024-03-06 ENCOUNTER — Other Ambulatory Visit: Payer: Self-pay | Admitting: Cardiovascular Disease

## 2024-05-23 ENCOUNTER — Other Ambulatory Visit: Payer: Self-pay | Admitting: Family Medicine

## 2024-05-23 DIAGNOSIS — Z1231 Encounter for screening mammogram for malignant neoplasm of breast: Secondary | ICD-10-CM

## 2024-05-26 ENCOUNTER — Encounter: Payer: Self-pay | Admitting: Cardiovascular Disease

## 2024-05-26 DIAGNOSIS — E785 Hyperlipidemia, unspecified: Secondary | ICD-10-CM

## 2024-05-27 ENCOUNTER — Other Ambulatory Visit (HOSPITAL_COMMUNITY): Payer: Self-pay

## 2024-05-27 MED ORDER — REPATHA SURECLICK 140 MG/ML ~~LOC~~ SOAJ: 140.0000 mg | SUBCUTANEOUS | 1 refills | Status: AC

## 2024-06-09 ENCOUNTER — Other Ambulatory Visit: Payer: Self-pay | Admitting: Medical Genetics

## 2024-06-12 ENCOUNTER — Ambulatory Visit

## 2024-06-25 ENCOUNTER — Ambulatory Visit
